# Patient Record
Sex: Female | Born: 1970 | Race: Black or African American | Hispanic: No | Marital: Married | Smoking: Former smoker
Health system: Southern US, Community
[De-identification: ages and names within clinical notes are randomized; demographics above are authoritative.]

## PROBLEM LIST (undated history)

## (undated) DIAGNOSIS — D219 Benign neoplasm of connective and other soft tissue, unspecified: Secondary | ICD-10-CM

---

## 1997-11-21 ENCOUNTER — Emergency Department (HOSPITAL_COMMUNITY): Admission: EM | Admit: 1997-11-21 | Discharge: 1997-11-21 | Payer: Self-pay | Admitting: Emergency Medicine

## 1999-08-27 ENCOUNTER — Other Ambulatory Visit: Admission: RE | Admit: 1999-08-27 | Discharge: 1999-08-27 | Payer: Self-pay | Admitting: Obstetrics and Gynecology

## 2005-03-11 ENCOUNTER — Inpatient Hospital Stay (HOSPITAL_COMMUNITY): Admission: AD | Admit: 2005-03-11 | Discharge: 2005-03-11 | Payer: Self-pay | Admitting: Obstetrics and Gynecology

## 2005-05-12 ENCOUNTER — Inpatient Hospital Stay (HOSPITAL_COMMUNITY): Admission: AD | Admit: 2005-05-12 | Discharge: 2005-05-14 | Payer: Self-pay | Admitting: Obstetrics & Gynecology

## 2009-10-23 ENCOUNTER — Ambulatory Visit: Payer: Self-pay | Admitting: Diagnostic Radiology

## 2009-10-23 ENCOUNTER — Ambulatory Visit (HOSPITAL_BASED_OUTPATIENT_CLINIC_OR_DEPARTMENT_OTHER): Admission: RE | Admit: 2009-10-23 | Discharge: 2009-10-23 | Payer: Self-pay | Admitting: Obstetrics and Gynecology

## 2010-10-20 ENCOUNTER — Other Ambulatory Visit (HOSPITAL_BASED_OUTPATIENT_CLINIC_OR_DEPARTMENT_OTHER): Payer: Self-pay | Admitting: Obstetrics and Gynecology

## 2010-10-20 DIAGNOSIS — Z1231 Encounter for screening mammogram for malignant neoplasm of breast: Secondary | ICD-10-CM

## 2010-10-27 ENCOUNTER — Ambulatory Visit (HOSPITAL_BASED_OUTPATIENT_CLINIC_OR_DEPARTMENT_OTHER): Payer: Self-pay

## 2010-10-28 ENCOUNTER — Ambulatory Visit (HOSPITAL_BASED_OUTPATIENT_CLINIC_OR_DEPARTMENT_OTHER)
Admission: RE | Admit: 2010-10-28 | Discharge: 2010-10-28 | Disposition: A | Discharge status: Home / Self Care | Payer: 59 | Source: Ambulatory Visit | Attending: Obstetrics and Gynecology | Admitting: Obstetrics and Gynecology

## 2010-10-28 DIAGNOSIS — Z1231 Encounter for screening mammogram for malignant neoplasm of breast: Secondary | ICD-10-CM | POA: Insufficient documentation

## 2011-03-14 ENCOUNTER — Emergency Department (INDEPENDENT_AMBULATORY_CARE_PROVIDER_SITE_OTHER): Payer: 59

## 2011-03-14 ENCOUNTER — Emergency Department (HOSPITAL_BASED_OUTPATIENT_CLINIC_OR_DEPARTMENT_OTHER)
Admission: EM | Admit: 2011-03-14 | Discharge: 2011-03-14 | Disposition: A | Discharge status: Home / Self Care | Payer: 59 | Attending: Emergency Medicine | Admitting: Emergency Medicine

## 2011-03-14 ENCOUNTER — Encounter: Payer: Self-pay | Admitting: *Deleted

## 2011-03-14 DIAGNOSIS — S139XXA Sprain of joints and ligaments of unspecified parts of neck, initial encounter: Secondary | ICD-10-CM | POA: Insufficient documentation

## 2011-03-14 DIAGNOSIS — M542 Cervicalgia: Secondary | ICD-10-CM

## 2011-03-14 DIAGNOSIS — Y9210 Unspecified residential institution as the place of occurrence of the external cause: Secondary | ICD-10-CM | POA: Insufficient documentation

## 2011-03-14 DIAGNOSIS — M545 Low back pain, unspecified: Secondary | ICD-10-CM | POA: Insufficient documentation

## 2011-03-14 DIAGNOSIS — S161XXA Strain of muscle, fascia and tendon at neck level, initial encounter: Secondary | ICD-10-CM

## 2011-03-14 DIAGNOSIS — M5380 Other specified dorsopathies, site unspecified: Secondary | ICD-10-CM

## 2011-03-14 DIAGNOSIS — S39012A Strain of muscle, fascia and tendon of lower back, initial encounter: Secondary | ICD-10-CM

## 2011-03-14 DIAGNOSIS — S335XXA Sprain of ligaments of lumbar spine, initial encounter: Secondary | ICD-10-CM | POA: Insufficient documentation

## 2011-03-14 LAB — PREGNANCY, URINE: Preg Test, Ur: NEGATIVE

## 2011-03-14 MED ORDER — METHOCARBAMOL 500 MG PO TABS: 500.0000 mg | ORAL_TABLET | Freq: Two times a day (BID) | ORAL | Status: AC

## 2011-03-14 MED ORDER — HYDROCODONE-ACETAMINOPHEN 5-325 MG PO TABS: 1.0000 | ORAL_TABLET | Freq: Four times a day (QID) | ORAL | Status: AC | PRN

## 2011-03-14 MED ORDER — OXYCODONE-ACETAMINOPHEN 5-325 MG PO TABS
1.0000 | ORAL_TABLET | Freq: Once | ORAL | Status: AC
  Administered 2011-03-14: 1 via ORAL
  Filled 2011-03-14: qty 1
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
## 2011-10-06 ENCOUNTER — Other Ambulatory Visit: Payer: Self-pay | Admitting: Obstetrics and Gynecology

## 2011-10-06 DIAGNOSIS — D219 Benign neoplasm of connective and other soft tissue, unspecified: Secondary | ICD-10-CM

## 2011-10-06 DIAGNOSIS — N921 Excessive and frequent menstruation with irregular cycle: Secondary | ICD-10-CM

## 2011-10-13 ENCOUNTER — Ambulatory Visit
Admission: RE | Admit: 2011-10-13 | Discharge: 2011-10-13 | Disposition: A | Discharge status: Home / Self Care | Payer: 59 | Source: Ambulatory Visit | Attending: Obstetrics and Gynecology | Admitting: Obstetrics and Gynecology

## 2011-10-13 ENCOUNTER — Other Ambulatory Visit: Payer: Self-pay | Admitting: Obstetrics and Gynecology

## 2011-10-13 DIAGNOSIS — D219 Benign neoplasm of connective and other soft tissue, unspecified: Secondary | ICD-10-CM

## 2011-10-13 DIAGNOSIS — N921 Excessive and frequent menstruation with irregular cycle: Secondary | ICD-10-CM

## 2011-10-13 DIAGNOSIS — N920 Excessive and frequent menstruation with regular cycle: Secondary | ICD-10-CM

## 2011-10-13 HISTORY — DX: Benign neoplasm of connective and other soft tissue, unspecified: D21.9

## 2011-10-16 NOTE — Progress Notes (Signed)
Patient ID: Katrina Waters, female   DOB: 1971-01-20, 41 y.o.   MRN: [id number] She is a 41 year old female who recently started having persistent and heavy menstrual bleeding. She says that these symptoms started approximately 2-3 months ago. Previously, her menstrual period had been very regular and she described it as normal. She was started on oral contraceptive pills in order to stop the bleeding to have an endometrial biopsy. She has continued to have mild menstrual bleeding every day and is requiring one pad per day. In addition, she is also complaining of some cramping and pain in the left lower quadrant of the abdomen. She also complains of frequent urination and nocturia. She has no significant history of fibroid surgery or gynecologic infections. She has recently started taking oral contraceptive pills. Recent Pap smear and endometrial biopsy are negative for neoplasm.  History for HPV status post acid treatment. She is taking Loestrin with iron 1/20. No other medications. No allergies. No past surgical history.  Social history: She is married and has one child. She works in a Airline pilot in [employer]. She denies alcohol or tobacco use. She says she smoked lightly 10 years ago. She drinks 5 cups of caffeine per week.  Gynecologic history: G2, P1.  Review of Systems: She has gained some weight recently, otherwise in good health. Denies eyes, ears, nose, throat or mouth problems. She does have some stomach and abdominal pains. GU system is significant for heavy and persistent menstrual bleeding. She denies cardiovascular or pulmonary disease. She denies neurologic or psychiatric problems.  Physical examination: Vital signs: Temperature 98.9, pulse 76, blood pressure 140/89, respirations 15. Oxygen saturation 98% on room air. General: She is a healthy appearing, overweight female. Heart: Regular rate and rhythm. Lungs: Clear to auscultation bilaterally. Abdomen: Soft and  nontender. Positive bowel sounds. Lower extremities: Bilateral groin and pedal pulses. Page 2 Re: Katrina Waters (DOB: [birth year])  Labs: Endometrial biopsy from 09/28/11 demonstrates underdeveloped endometrium with deciduoid stroma and decrease in glands to stroma ratio suggestive of progesterone effect and negative for atypical hyperplasia. Pap smear dated 08/31/10 is negative for intraepithelial lesion and malignancy. Labs on 08/31/11 demonstrate a white count of 9.5, hemoglobin 11.4 and platelet count of 339.  Imaging: Ultrasound from [facility] dated 09/10/11 demonstrates multiple fibroids. Endometrial thickness is 2.1 cm. uterus is anteverted. No abnormalities of the ovaries.  Assessment: 41 year old with menometrorrhagia. The patient's condition may very well be related to uterine fibroids. I discussed the different treatment options for uterine fibroids, including oral contraceptives, hysterectomy, myomectomy and uterine artery embolization procedure. We discussed the uterine artery embolization procedure in depth, including the need for a preprocedure MRI, the procedure itself and the recovery. I feel the patient has a very good understanding of the procedures, risks and benefits. The patient's sister had a uterine artery embolization for heavy menstrual bleeding and had a very good result. As a result, the patient would like to pursue the uterine artery embolization for her menometrorrhagia. We will schedule the patient for an MRI of the pelvis. If the patient is a good candidate for the procedure based on the imaging, we will schedule uterine artery embolization procedure at [facility].

## 2011-10-18 ENCOUNTER — Other Ambulatory Visit: Payer: 59

## 2011-10-19 ENCOUNTER — Other Ambulatory Visit: Payer: 59

## 2011-10-21 ENCOUNTER — Other Ambulatory Visit: Payer: 59

## 2011-10-26 ENCOUNTER — Ambulatory Visit
Admission: RE | Admit: 2011-10-26 | Discharge: 2011-10-26 | Disposition: A | Discharge status: Home / Self Care | Payer: 59 | Source: Ambulatory Visit | Attending: Obstetrics and Gynecology | Admitting: Obstetrics and Gynecology

## 2011-10-26 DIAGNOSIS — N920 Excessive and frequent menstruation with regular cycle: Secondary | ICD-10-CM

## 2011-10-26 DIAGNOSIS — D219 Benign neoplasm of connective and other soft tissue, unspecified: Secondary | ICD-10-CM

## 2011-10-28 ENCOUNTER — Other Ambulatory Visit (HOSPITAL_BASED_OUTPATIENT_CLINIC_OR_DEPARTMENT_OTHER): Payer: Self-pay | Admitting: Obstetrics and Gynecology

## 2011-10-28 DIAGNOSIS — Z1231 Encounter for screening mammogram for malignant neoplasm of breast: Secondary | ICD-10-CM

## 2011-10-29 ENCOUNTER — Ambulatory Visit (HOSPITAL_BASED_OUTPATIENT_CLINIC_OR_DEPARTMENT_OTHER)
Admission: RE | Admit: 2011-10-29 | Discharge: 2011-10-29 | Disposition: A | Discharge status: Home / Self Care | Payer: 59 | Source: Ambulatory Visit | Attending: Obstetrics and Gynecology | Admitting: Obstetrics and Gynecology

## 2011-10-29 DIAGNOSIS — Z1231 Encounter for screening mammogram for malignant neoplasm of breast: Secondary | ICD-10-CM

## 2011-10-30 ENCOUNTER — Other Ambulatory Visit: Payer: Self-pay | Admitting: Diagnostic Radiology

## 2011-10-30 DIAGNOSIS — N920 Excessive and frequent menstruation with regular cycle: Secondary | ICD-10-CM

## 2011-11-12 ENCOUNTER — Ambulatory Visit (HOSPITAL_COMMUNITY): Payer: 59

## 2011-11-12 ENCOUNTER — Inpatient Hospital Stay (HOSPITAL_COMMUNITY): Admission: RE | Admit: 2011-11-12 | Payer: 59 | Source: Ambulatory Visit

## 2011-12-01 ENCOUNTER — Other Ambulatory Visit: Payer: Self-pay | Admitting: Radiology

## 2011-12-03 ENCOUNTER — Ambulatory Visit (HOSPITAL_COMMUNITY)
Admission: RE | Admit: 2011-12-03 | Discharge: 2011-12-04 | Disposition: A | Discharge status: Home / Self Care | Payer: 59 | Source: Ambulatory Visit | Attending: Diagnostic Radiology | Admitting: Diagnostic Radiology

## 2011-12-03 ENCOUNTER — Encounter (HOSPITAL_COMMUNITY): Payer: Self-pay

## 2011-12-03 DIAGNOSIS — N920 Excessive and frequent menstruation with regular cycle: Secondary | ICD-10-CM

## 2011-12-03 DIAGNOSIS — D219 Benign neoplasm of connective and other soft tissue, unspecified: Secondary | ICD-10-CM | POA: Diagnosis present

## 2011-12-03 DIAGNOSIS — N938 Other specified abnormal uterine and vaginal bleeding: Secondary | ICD-10-CM | POA: Insufficient documentation

## 2011-12-03 DIAGNOSIS — D259 Leiomyoma of uterus, unspecified: Secondary | ICD-10-CM | POA: Insufficient documentation

## 2011-12-03 DIAGNOSIS — D649 Anemia, unspecified: Secondary | ICD-10-CM | POA: Insufficient documentation

## 2011-12-03 DIAGNOSIS — N949 Unspecified condition associated with female genital organs and menstrual cycle: Secondary | ICD-10-CM | POA: Insufficient documentation

## 2011-12-03 HISTORY — PX: UTERINE ARTERY EMBOLIZATION: SHX2629

## 2011-12-03 LAB — CBC WITH DIFFERENTIAL/PLATELET
Basophils Relative: 0 % (ref 0–1)
Eosinophils Absolute: 0.1 10*3/uL (ref 0.0–0.7)
Eosinophils Relative: 1 % (ref 0–5)
Lymphs Abs: 2.1 10*3/uL (ref 0.7–4.0)
MCH: 28.1 pg (ref 26.0–34.0)
MCHC: 32.2 g/dL (ref 30.0–36.0)
MCV: 87.2 fL (ref 78.0–100.0)
Monocytes Relative: 5 % (ref 3–12)
Neutrophils Relative %: 64 % (ref 43–77)
Platelets: 326 10*3/uL (ref 150–400)
RBC: 3.84 MIL/uL — ABNORMAL LOW (ref 3.87–5.11)

## 2011-12-03 LAB — BASIC METABOLIC PANEL
BUN: 8 mg/dL (ref 6–23)
CO2: 24 mEq/L (ref 19–32)
Calcium: 9 mg/dL (ref 8.4–10.5)
GFR calc non Af Amer: >90 mL/min (ref >90)
Glucose, Bld: 102 mg/dL — ABNORMAL HIGH (ref 70–99)

## 2011-12-03 LAB — PROTIME-INR: Prothrombin Time: 12.8 seconds (ref 11.6–15.2)

## 2011-12-03 MED ORDER — IOHEXOL 300 MG/ML  SOLN: 90.0000 mL | Freq: Once | INTRAMUSCULAR | Status: AC | PRN

## 2011-12-03 MED ORDER — ONDANSETRON HCL 4 MG/2ML IJ SOLN: 4.0000 mg | Freq: Four times a day (QID) | INTRAMUSCULAR | Status: DC | PRN

## 2011-12-03 MED ORDER — MIDAZOLAM HCL 5 MG/5ML IJ SOLN
INTRAMUSCULAR | Status: AC | PRN
  Administered 2011-12-03 (×2): 2 mg via INTRAVENOUS

## 2011-12-03 MED ORDER — HYDROMORPHONE 0.3 MG/ML IV SOLN
INTRAVENOUS | Status: DC
  Administered 2011-12-03: 0.9 mg via INTRAVENOUS
  Administered 2011-12-03: 1.5 mg via INTRAVENOUS
  Administered 2011-12-04: 0.9 mg via INTRAVENOUS
  Administered 2011-12-04: 0.3 mg via INTRAVENOUS
  Administered 2011-12-04: 1.5 mg via INTRAVENOUS
  Administered 2011-12-04: 0.6 mg via INTRAVENOUS

## 2011-12-03 MED ORDER — FENTANYL CITRATE 0.05 MG/ML IJ SOLN
INTRAMUSCULAR | Status: AC
  Filled 2011-12-03: qty 8

## 2011-12-03 MED ORDER — DIPHENHYDRAMINE HCL 50 MG/ML IJ SOLN: 12.5000 mg | Freq: Four times a day (QID) | INTRAMUSCULAR | Status: DC | PRN

## 2011-12-03 MED ORDER — SODIUM CHLORIDE 0.9 % IJ SOLN: 9.0000 mL | INTRAMUSCULAR | Status: DC | PRN

## 2011-12-03 MED ORDER — NALOXONE HCL 0.4 MG/ML IJ SOLN: 0.4000 mg | INTRAMUSCULAR | Status: DC | PRN

## 2011-12-03 MED ORDER — HYDROMORPHONE HCL PF 2 MG/ML IJ SOLN
INTRAMUSCULAR | Status: AC
  Filled 2011-12-03: qty 1

## 2011-12-03 MED ORDER — DIPHENHYDRAMINE HCL 12.5 MG/5ML PO ELIX: 12.5000 mg | ORAL_SOLUTION | Freq: Four times a day (QID) | ORAL | Status: DC | PRN

## 2011-12-03 MED ORDER — CEFAZOLIN SODIUM-DEXTROSE 2-3 GM-% IV SOLR
2.0000 g | Freq: Once | INTRAVENOUS | Status: AC
  Administered 2011-12-03: 2 g via INTRAVENOUS
  Filled 2011-12-03: qty 50

## 2011-12-03 MED ORDER — HYDROMORPHONE HCL PF 1 MG/ML IJ SOLN
INTRAMUSCULAR | Status: AC | PRN
  Administered 2011-12-03: 2 mg via INTRAVENOUS

## 2011-12-03 MED ORDER — PROMETHAZINE HCL 25 MG PO TABS
25.0000 mg | ORAL_TABLET | Freq: Three times a day (TID) | ORAL | Status: DC | PRN
  Administered 2011-12-03: 25 mg via ORAL
  Filled 2011-12-03: qty 1

## 2011-12-03 MED ORDER — DIPHENHYDRAMINE HCL 12.5 MG/5ML PO ELIX
12.5000 mg | ORAL_SOLUTION | Freq: Four times a day (QID) | ORAL | Status: DC | PRN
  Filled 2011-12-03: qty 5

## 2011-12-03 MED ORDER — SODIUM CHLORIDE 0.9 % IV SOLN: INTRAVENOUS | Status: DC

## 2011-12-03 MED ORDER — SODIUM CHLORIDE 0.9 % IJ SOLN: 3.0000 mL | Freq: Two times a day (BID) | INTRAMUSCULAR | Status: DC

## 2011-12-03 MED ORDER — KETOROLAC TROMETHAMINE 30 MG/ML IJ SOLN
30.0000 mg | Freq: Four times a day (QID) | INTRAMUSCULAR | Status: DC
  Administered 2011-12-03 – 2011-12-04 (×3): 30 mg via INTRAVENOUS
  Filled 2011-12-03 (×7): qty 1

## 2011-12-03 MED ORDER — ONDANSETRON HCL 4 MG/2ML IJ SOLN
4.0000 mg | Freq: Four times a day (QID) | INTRAMUSCULAR | Status: DC | PRN
  Administered 2011-12-03: 4 mg via INTRAVENOUS
  Filled 2011-12-03: qty 2

## 2011-12-03 MED ORDER — KETOROLAC TROMETHAMINE 30 MG/ML IJ SOLN
30.0000 mg | Freq: Once | INTRAMUSCULAR | Status: AC
  Administered 2011-12-03: 30 mg via INTRAVENOUS
  Filled 2011-12-03: qty 1

## 2011-12-03 MED ORDER — FENTANYL CITRATE 0.05 MG/ML IJ SOLN
INTRAMUSCULAR | Status: AC | PRN
  Administered 2011-12-03 (×2): 100 ug via INTRAVENOUS

## 2011-12-03 MED ORDER — MIDAZOLAM HCL 2 MG/2ML IJ SOLN
INTRAMUSCULAR | Status: AC
  Filled 2011-12-03: qty 8

## 2011-12-03 MED ORDER — HYDROMORPHONE 0.3 MG/ML IV SOLN
INTRAVENOUS | Status: DC
  Administered 2011-12-03: 10:00:00 via INTRAVENOUS
  Filled 2011-12-03: qty 25

## 2011-12-03 MED ORDER — PROMETHAZINE HCL 25 MG RE SUPP: 25.0000 mg | Freq: Three times a day (TID) | RECTAL | Status: DC | PRN

## 2011-12-03 MED ORDER — DOCUSATE SODIUM 100 MG PO CAPS
100.0000 mg | ORAL_CAPSULE | Freq: Two times a day (BID) | ORAL | Status: DC
  Administered 2011-12-03 – 2011-12-04 (×2): 100 mg via ORAL
  Filled 2011-12-03 (×4): qty 1

## 2011-12-03 NOTE — H&P (Signed)
Katrina Waters is an 41 y.o. female.   Chief Complaint: symptomatic uterine fibroids - presents today for uterine artery embolization procedure.   HPI: see consult note below : *RADIOLOGY REPORT*   October 13, 2011   Katrina Better, MD [address] [place], [place] [address]   Re: Katrina Waters (DOB:  [birth year])   Dear Dr. Cherly Waters:   Thank you for sending Katrina Waters to [facility] for evaluation of her menorrhagia.  She is a 41 year old female who recently started having persistent and heavy menstrual bleeding. She says that these symptoms started approximately 2-3 months ago. Previously, her menstrual period had been very regular and she described it as normal.  She was started on oral contraceptive pills in order to stop the bleeding to have an endometrial biopsy. She has continued to have mild menstrual bleeding every day and is requiring one pad per day.  In addition, she is also complaining of some cramping and pain in the left lower quadrant of the abdomen. She also complains of frequent urination and nocturia.  She has no significant history of fibroid surgery or gynecologic infections. She has recently started taking oral contraceptive pills.  Recent Pap smear and endometrial biopsy are negative for neoplasm.   History for HPV status post acid treatment.  She is taking Loestrin with iron 1/20.  No other medications. No allergies.  No past surgical history.   Social history:  She is married and has one child.  She works in a Airline pilot in [employer].  She denies alcohol or tobacco use.  She says she smoked lightly 10 years ago.  She drinks 5 cups of caffeine per week.   Gynecologic history:  G2, P1.   Review of Systems:  She has gained some weight recently, otherwise in good health.  Denies eyes, ears, nose, throat or mouth problems. She does have some stomach and abdominal pains.  GU system is significant for heavy and persistent menstrual  bleeding. She denies cardiovascular or pulmonary disease.  She denies neurologic or psychiatric problems.   Physical examination:  Vital signs:  Temperature 98.9, pulse 76, blood pressure 140/89, respirations 15.  Oxygen saturation 98% on room air.  General:  She is a healthy appearing, overweight female. Heart:  Regular rate and rhythm.  Lungs:  Clear to auscultation bilaterally.  Abdomen:  Soft and nontender.  Positive bowel sounds. Lower extremities:  Bilateral groin and pedal pulses. Page 2 Re: Katrina Waters (DOB:  [birth year])   Labs:  Endometrial biopsy from 09/28/11 demonstrates underdeveloped endometrium with deciduoid stroma and decrease in glands to stroma ratio suggestive of progesterone effect and negative for atypical hyperplasia.  Pap smear dated 08/31/10 is negative for intraepithelial lesion and malignancy.  Labs on 08/31/11 demonstrate a white count of 9.5, hemoglobin 11.4 and platelet count of 339.   Imaging:  Ultrasound from [facility] dated 09/10/11 demonstrates multiple fibroids.  Endometrial thickness is 2.1 cm. uterus is anteverted.  No abnormalities of the ovaries.   Assessment:  41 year old with menometrorrhagia. The patient's condition may very well be related to uterine fibroids.  I discussed the different treatment options for uterine fibroids, including oral contraceptives, hysterectomy, myomectomy and uterine artery embolization procedure.  We discussed the uterine artery embolization procedure in depth, including the need for a preprocedure MRI, the procedure itself and the recovery.  I feel the patient has a very good understanding of the procedures, risks and benefits.   The patient's sister had a uterine artery embolization for  heavy menstrual bleeding and had a very good result.  As a result, the patient would like to pursue the uterine artery embolization for her menometrorrhagia.  We will schedule the patient for an MRI of the pelvis.  If the  patient is a good candidate for the procedure based on the imaging, we will schedule uterine artery embolization procedure at [facility].   Thank you for referring this patient to [facility].  If you have any questions or concerns, please feel free to contact us.   Sincerely,   Katrina Overlie, MD  Today's examination findings :  Past Medical History  Diagnosis Date  . Fibroids    No past surgical history on file.  Social History:  reports that she quit smoking about 5 months ago. Her smoking use included Cigarettes. She has a 1.5 pack-year smoking history. She has never used smokeless tobacco. She reports that she does not drink alcohol or use illicit drugs. Married, husband's name is Katrina Waters. 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   Allergies: No Known Allergies  Medications Prior to Admission  Medication Sig Dispense Refill  . norethindrone-ethinyl estradiol (JUNEL FE,GILDESS FE,LOESTRIN FE) 1-20 MG-MCG tablet Take 1 tablet by mouth daily.          Prior Imaging : RADIOLOGY REPORT*   Clinical Data: Uterine fibroids.  Pre uterine artery embolization exam.   MRI PELVIS WITHOUT AND WITH CONTRAST   Technique:  Multiplanar multisequence MR imaging of the pelvis was performed both before and after administration of intravenous contrast.   Contrast:  20 ml Multihance   Comparison: None.   Findings: The uterus is anteverted and anteflexed.  Overall measurements 13.2 x 9.9 x 10.0 cm (686 ml volume).  Multiple presumed fibroids are noted, with dominant lesions as follows:   Right posterolateral uterine body, 5.1 x 4.9 x 4.8 cm, without internal evidence for necrosis or hemorrhage.   Left lateral uterine body intramural partly necrotic fibroid, producing rightward displacement of the endometrial stripe, 5.3 x 5.0 x 4.7 cm.   Partly subserosal / intramural fundal anterior fibroid, 2.0 x 1.6 x 1.4 cm.   Partly subserosal / intramural anterior uterine  body fibroid which is the most relatively anterior in location measures 3.1 x 2.9 x 4.8 cm, with no measurable overlying myometrial thickness (image 14, series 4).   The ovaries are normal.  Visualized bowel, appendix, and decompressed bladder are normal.  No free fluid or lymphadenopathy. Normal visualized bony structures allowing for technique.   On postcontrast images, there is homogeneous enhancement of many of the fibroids listed above, including the smaller fundal subserosal fibroid, although several fibroids do demonstrate a degree of internal presumed necrosis/T2 hyperintensity and lack of internal enhancement, including the 5.3 cm fibroid described above.   IMPRESSION: Fibroid uterus as above.  Most are predominately intramural in position, and some produce mass effect upon the endometrial stripe.   Original Report Authenticated By: Harrel Lemon, M.  Labs :  Results for orders placed during the [facility] encounter of 12/03/11 (from the past 48 hour(s))  APTT     Status: Normal   Collection Time   12/03/11  8:11 AM      Component Value Range Comment   aPTT 30  24 - 37 seconds   BASIC METABOLIC PANEL     Status: Abnormal   Collection Time   12/03/11  8:11 AM      Component Value Range Comment   Sodium 135  135 - 145 mEq/L    Potassium 3.8  3.5 - 5.1 mEq/L    Chloride 103  96 - 112 mEq/L    CO2 24  19 - 32 mEq/L    Glucose, Bld 102 (*) 70 - 99 mg/dL    BUN 8  6 - 23 mg/dL    Creatinine, Ser 1.61  0.50 - 1.10 mg/dL    Calcium 9.0  8.4 - 09.6 mg/dL    GFR calc non Af Amer >90  >90 mL/min    GFR calc Af Amer >90  >90 mL/min   CBC WITH DIFFERENTIAL     Status: Abnormal   Collection Time   12/03/11  8:11 AM      Component Value Range Comment   WBC 7.0  4.0 - 10.5 K/uL    RBC 3.84 (*) 3.87 - 5.11 MIL/uL    Hemoglobin 10.8 (*) 12.0 - 15.0 g/dL    HCT 04.5 (*) 40.9 - 46.0 %    MCV 87.2  78.0 - 100.0 fL    MCH 28.1  26.0 - 34.0 pg    MCHC 32.2  30.0 - 36.0 g/dL    RDW  81.1  91.4 - 78.2 %    Platelets 326  150 - 400 K/uL    Neutrophils Relative 64  43 - 77 %    Neutro Abs 4.5  1.7 - 7.7 K/uL    Lymphocytes Relative 30  12 - 46 %    Lymphs Abs 2.1  0.7 - 4.0 K/uL    Monocytes Relative 5  3 - 12 %    Monocytes Absolute 0.4  0.1 - 1.0 K/uL    Eosinophils Relative 1  0 - 5 %    Eosinophils Absolute 0.1  0.0 - 0.7 K/uL    Basophils Relative 0  0 - 1 %    Basophils Absolute 0.0  0.0 - 0.1 K/uL   HCG, SERUM, QUALITATIVE     Status: Normal   Collection Time   12/03/11  8:11 AM      Component Value Range Comment   Preg, Serum NEGATIVE  NEGATIVE   PROTIME-INR     Status: Normal   Collection Time   12/03/11  8:11 AM      Component Value Range Comment   Prothrombin Time 12.8  11.6 - 15.2 seconds    INR 0.94  0.00 - 1.49     Review of Systems  Constitutional: Negative for fever and chills.  Respiratory: Negative.   Cardiovascular: Negative.   Gastrointestinal: Negative.        Obese abdomen   Genitourinary: Positive for urgency and frequency.       Nocturia   Musculoskeletal: Negative.   Skin: Negative.   Neurological: Negative.   Endo/Heme/Allergies: Does not bruise/bleed easily.       Anemia   Psychiatric/Behavioral: Negative.     Blood pressure 129/85, pulse 76, temperature 98.2 F (36.8 C), resp. rate 21, last menstrual period 12/01/2011, SpO2 99.00%. Physical Exam  Constitutional: She is oriented to person, place, and time. She appears well-developed and well-nourished. No distress.  HENT:  Head: Normocephalic and atraumatic.  Cardiovascular: Normal rate, regular rhythm, normal heart sounds and intact distal pulses.  Exam reveals no gallop and no friction rub.   No murmur heard. Respiratory: Effort normal and breath sounds normal. No respiratory distress. She has no wheezes. She has no rales.  GI: Soft. She exhibits no distension. There is no tenderness.  Musculoskeletal: Normal  range of motion. She exhibits no edema and no tenderness.    Neurological: She is alert and oriented to person, place, and time.  Skin: Skin is warm and dry.  Psychiatric: She has a normal mood and affect. Her behavior is normal. Judgment and thought content normal.     Assessment/Plan Procedure details, risks and benefits for [place] reviewed with patient and spouse with their apparent understanding and all their questions answered to their satisfaction. Labs are WNL to proceed. Patient to be admitted for observation overnight for pain management with expected discharge following day.   Vesper Trant D 12/03/2011, 8:54 AM

## 2011-12-03 NOTE — Procedures (Signed)
Successful bilateral uterine artery embolization.  No immediate complication.

## 2011-12-04 DIAGNOSIS — D219 Benign neoplasm of connective and other soft tissue, unspecified: Secondary | ICD-10-CM | POA: Diagnosis present

## 2011-12-04 MED ORDER — HYDROCODONE-ACETAMINOPHEN 5-325 MG PO TABS
1.0000 | ORAL_TABLET | Freq: Four times a day (QID) | ORAL | Status: DC | PRN
  Administered 2011-12-04: 1 via ORAL
  Filled 2011-12-04: qty 1

## 2011-12-04 MED ORDER — DSS 100 MG PO CAPS: 100.0000 mg | ORAL_CAPSULE | Freq: Two times a day (BID) | ORAL | Status: AC

## 2011-12-04 MED ORDER — PROMETHAZINE HCL 25 MG PO TABS: 25.0000 mg | ORAL_TABLET | Freq: Three times a day (TID) | ORAL | Status: DC | PRN

## 2011-12-04 MED ORDER — HYDROCODONE-ACETAMINOPHEN 5-325 MG PO TABS: 1.0000 | ORAL_TABLET | Freq: Four times a day (QID) | ORAL | Status: AC | PRN

## 2011-12-04 MED ORDER — IBUPROFEN 600 MG PO TABS: 600.0000 mg | ORAL_TABLET | Freq: Four times a day (QID) | ORAL | Status: AC

## 2011-12-04 NOTE — Progress Notes (Signed)
Subjective:  Feeling ok post procedure Foley out, ambulate to bathroom without problems. Pain adequately controlled with occasional PCA use.  Objective: Vital signs in last 24 hours: Afebrile  Husband at bedside. Groin site C/D/I  Intake/Output:   A few crackers.   Studies/Results: Ir Uterine Fibroid Embolization Mod Sed  12/03/2011  *RADIOLOGY REPORT*  Indication: 41 year old with menometrorrhagia and uterine fibroids.  PROCEDURE(S):  BILATERAL UTERINE ARTERY EMBOLIZATION  Physician:  Rachelle Hora. Henn, MD  Medications: Versed 4 mg, Fentanyl 200 mcg.  Dilaudid 2 mg. Ancef 2 gm. A radiology nurse monitored the patient for moderate sedation.  As antibiotic prophylaxis, Ancef  was ordered pre-procedure and administered intravenously within one hour of incision.  Moderate sedation time: 95 minutes  Fluoroscopy time: 23.5 minutes  Contrast:  90 ml Omnipaque-300  Procedure:Informed consent was obtained for the uterine artery embolization procedure.  The patient was placed supine on the interventional table.  The patient had palpable pedal pulses.  The right groin was prepped and draped in a sterile fashion.  Maximal barrier sterile technique was utilized including caps, mask, sterile gowns, sterile gloves, sterile drape, hand hygiene and skin antiseptic.  The skin was anesthetized 1% lidocaine.  Using ultrasound guidance, 21 gauge needle was directed in the right common femoral artery and a micropuncture dilator set was placed. The vascular access was upsized to a 5-French vascular sheath.  A Cobra catheter was used to cannulate the left common iliac artery and the left internal iliac artery.  A series of arteriograms were performed to identify the left uterine artery orfice.  A Progreat microcatheter was advanced into the left uterine artery.  Two vials of Embospheres (500 - 700 micron) were injected through the microcatheter with fluoroscopic guidance.  There was near complete stasis of the left  uterine artery following administration of the particles.  A Waltman's loop was formed using the Cobra catheter and the right internal iliac artery was cannulated.  The right uterine artery was identified with contrast angiograms.  The microcatheter was advanced into the right uterine artery and a series of angiograms were performed. Two vials of Embospheres (500- 700 micron) were injected through the microcatheter with fluoroscopic guidance.  There was near complete stasis of the right uterine artery at the end of the procedure.  The microcatheter was removed.  The Cobra catheter was straightened out over the aortic bifurcation and removed over a wire.  Angiogram performed through the right groin sheath.  The vascular sheath was removed with an Exoseal closure device . There was hemostasis at the groin and no evidence for hematoma formation or bleeding.  Findings:Large uterine arteries bilaterally.  Near complete stasis of the uterine arteries at the end of the procedure. The micro catheter was advanced beyond a proximal right uterine branch prior to particle administration.  This proximal right uterine branch was thought to represent a cervicovaginal branch.  Fluoroscopic images were obtained for documentation.  Complicatons: None  Impression:Successful uterine artery embolization procedure.   Original Report Authenticated By: Richarda Overlie, M.D.     Anti-infectives: Anti-infectives     Start     Dose/Rate Route Frequency Ordered Stop   12/03/11 0800   ceFAZolin (ANCEF) IVPB 2 g/50 mL premix        2 g 100 mL/hr over 30 Minutes Intravenous  Once 12/03/11 1610 12/03/11 1033          Assessment/Plan: S/p UFE without early complication Continue conservative management, prob home in AM   LOS: 1 day  Katrina Waters III,Katrina Waters

## 2011-12-04 NOTE — Discharge Summary (Signed)
Physician Discharge Summary  Patient ID: Katrina Waters MRN: [id number] DOB/AGE: [id number] 42 y.o.  Admit date: 12/03/2011 Discharge date: 12/04/2011  Admission Diagnoses: 1. Uterine fibroids 2. Dysfunctional uterine bleeding 3. Anemia  Discharge Diagnoses:  1) uterine fibroids s/p uterine artery embolization.   Discharged Condition: stable  Hospital Course: Patient admitted for overnight observation for pain management and hemodynamic monitoring s/p elective uterine artery embolization for dysfunctional uterine bleeding, anemia and menorrhagia.  Patient had pain controlled in the evening with PCA pump.  She experienced no nausea, vomiting and was tolerating a full regular diet.  Her vitals remained stable throughout admission. Prior to discharge she was changed to po pain meds of which she tolerated without difficulty.  She was discharged to home with several prescriptions, and to be seen back in the clinic for follow up with Dr. Lowella Dandy. Post discharge instructions reviewed with patient with her apparent understanding.   Consults: none  Significant Diagnostic Studies:  Results for LAYNA, ROEPER (MRN [id number]) as of 12/04/2011 11:31  Ref. Range 12/03/2011 08:11  Sodium Latest Range: 135-145 mEq/L 135  Potassium Latest Range: 3.5-5.1 mEq/L 3.8  Chloride Latest Range: 96-112 mEq/L 103  CO2 Latest Range: 19-32 mEq/L 24  BUN Latest Range: 6-23 mg/dL 8  Creatinine Latest Range: 0.50-1.10 mg/dL 1.30  Calcium Latest Range: 8.4-10.5 mg/dL 9.0  GFR calc non Af Amer Latest Range: >90 mL/min >90  GFR calc Af Amer Latest Range: >90 mL/min >90  Glucose Latest Range: 70-99 mg/dL 865 (H)  WBC Latest Range: 4.0-10.5 K/uL 7.0  RBC Latest Range: 3.87-5.11 MIL/uL 3.84 (L)  Hemoglobin Latest Range: 12.0-15.0 g/dL 78.4 (L)  HCT Latest Range: 36.0-46.0 % 33.5 (L)  MCV Latest Range: 78.0-100.0 fL 87.2  MCH Latest Range: 26.0-34.0 pg 28.1  MCHC Latest Range: 30.0-36.0 g/dL 69.6  RDW Latest Range:  11.5-15.5 % 13.6  Platelets Latest Range: 150-400 K/uL 326  Neutrophils Relative Latest Range: 43-77 % 64  Lymphocytes Relative Latest Range: 12-46 % 30  Monocytes Relative Latest Range: 3-12 % 5  Eosinophils Relative Latest Range: 0-5 % 1  Basophils Relative Latest Range: 0-1 % 0  NEUT# Latest Range: 1.7-7.7 K/uL 4.5  Lymphocytes Absolute Latest Range: 0.7-4.0 K/uL 2.1  Monocytes Absolute Latest Range: 0.1-1.0 K/uL 0.4  Eosinophils Absolute Latest Range: 0.0-0.7 K/uL 0.1  Basophils Absolute Latest Range: 0.0-0.1 K/uL 0.0  Prothrombin Time Latest Range: 11.6-15.2 seconds 12.8  INR Latest Range: 0.00-1.49  0.94  aPTT Latest Range: 24-37 seconds 30  Preg, Serum Latest Range: NEGATIVE  NEGATIVE    Treatments: *RADIOLOGY REPORT*   Indication: 41 year old with menometrorrhagia and uterine fibroids.   PROCEDURE(S):  BILATERAL UTERINE ARTERY EMBOLIZATION   Physician:  Rachelle Hora. Henn, MD   Medications: Versed 4 mg, Fentanyl 200 mcg.  Dilaudid 2 mg. Ancef 2 gm. A radiology nurse monitored the patient for moderate sedation.   As antibiotic prophylaxis, Ancef  was ordered pre-procedure and administered intravenously within one hour of incision.   Moderate sedation time: 95 minutes   Fluoroscopy time: 23.5 minutes   Contrast:  90 ml Omnipaque-300   Procedure:Informed consent was obtained for the uterine artery embolization procedure.  The patient was placed supine on the interventional table.  The patient had palpable pedal pulses.  The right groin was prepped and draped in a sterile fashion.  Maximal barrier sterile technique was utilized including caps, mask, sterile gowns, sterile gloves, sterile drape, hand hygiene and skin antiseptic.  The skin was anesthetized 1% lidocaine.  Using ultrasound guidance,  21 gauge needle was directed in the right common femoral artery and a micropuncture dilator set was placed. The vascular access was upsized to a 5-French vascular sheath.   A Cobra catheter was used to cannulate the left common iliac artery and the left internal iliac artery.  A series of arteriograms were performed to identify the left uterine artery orfice.  A Progreat microcatheter was advanced into the left uterine artery.  Two vials of Embospheres (500 - 700 micron) were injected through the microcatheter with fluoroscopic guidance.  There was near complete stasis of the left uterine artery following administration of the particles.  A Waltman's loop was formed using the Cobra catheter and the right internal iliac artery was cannulated.  The right uterine artery was identified with contrast angiograms.  The microcatheter was advanced into the right uterine artery and a series of angiograms were performed. Two vials of Embospheres (500- 700 micron) were injected through the microcatheter with fluoroscopic guidance.  There was near complete stasis of the right uterine artery at the end of the procedure.  The microcatheter was removed.  The Cobra catheter was straightened out over the aortic bifurcation and removed over a wire.  Angiogram performed through the right groin sheath.  The vascular sheath was removed with an Exoseal closure device . There was hemostasis at the groin and no evidence for hematoma formation or bleeding.   Findings:Large uterine arteries bilaterally.  Near complete stasis of the uterine arteries at the end of the procedure. The micro catheter was advanced beyond a proximal right uterine branch prior to particle administration.  This proximal right uterine branch was thought to represent a cervicovaginal branch.  Fluoroscopic images were obtained for documentation.   Complicatons: None   Impression:Successful uterine artery embolization procedure.     Original Report Authenticated By: Richarda Overlie, M.D.   Discharge Exam: Blood pressure 114/70, pulse 76, temperature 98.2 F (36.8 C), temperature source Oral, resp. rate 14,  last menstrual period 12/01/2011, SpO2 96.00%.  PE:  Awake, alert, tolerating diet.   Has minimal pain.  Scant spotting last pm has resolved. Resp: CTA bilaterally CV: RRR without murmurs rubs or gallops Abd: soft, Nondistended, + BS.  MS:  Groin site with clean bandage, no hematoma.  Right femoral and RT DP pulses 2+ with warm RLE.   Disposition: 01-Home or Self Care   Medication List  As of 12/04/2011 11:27 AM   TAKE these medications         DSS 100 MG Caps   Take 100 mg by mouth 2 (two) times daily.      HYDROcodone-acetaminophen 5-325 MG per tablet   Commonly known as: NORCO/VICODIN   Take 1-2 tablets by mouth every 6 (six) hours as needed.      ibuprofen 600 MG tablet   Commonly known as: ADVIL,MOTRIN   Take 1 tablet (600 mg total) by mouth 4 (four) times daily.      norethindrone-ethinyl estradiol 1-20 MG-MCG tablet   Commonly known as: JUNEL FE,GILDESS FE,LOESTRIN FE   Take 1 tablet by mouth daily.      promethazine 25 MG tablet   Commonly known as: PHENERGAN   Take 1 tablet (25 mg total) by mouth every 8 (eight) hours as needed for nausea.           Follow-up Information    Follow up with Abundio Miu, MD. (Tammy from office to call you with appointment details)    Contact information:   [facility] at  [facility]  Tammy [phone]           Signed: Anselm Pancoast 12/04/2011, 11:27 AM

## 2011-12-15 ENCOUNTER — Other Ambulatory Visit: Payer: Self-pay | Admitting: Diagnostic Radiology

## 2011-12-15 DIAGNOSIS — D259 Leiomyoma of uterus, unspecified: Secondary | ICD-10-CM

## 2011-12-22 ENCOUNTER — Ambulatory Visit
Admission: RE | Admit: 2011-12-22 | Discharge: 2011-12-22 | Disposition: A | Discharge status: Home / Self Care | Payer: 59 | Source: Ambulatory Visit | Attending: Diagnostic Radiology | Admitting: Diagnostic Radiology

## 2011-12-22 DIAGNOSIS — D259 Leiomyoma of uterus, unspecified: Secondary | ICD-10-CM

## 2011-12-22 NOTE — Progress Notes (Signed)
LMP:  12-07-2011 x 5-6 days.  Day 1 heavy flow.  States that she is experiencing occasional spotting & cramping.  Afebrile. Denies foul odor. Has started walking for exercise.  States that she is doing well.  No complaints.

## 2012-03-07 ENCOUNTER — Telehealth: Payer: Self-pay | Admitting: Radiology

## 2012-03-07 NOTE — Telephone Encounter (Signed)
3 mo f/u [place] phone call.  Pt reports that she is doing well.  LMP:  02/29/2012.  Minimal spotting.  Minimal cramping.    States that she does not feel that she needs app't for f/u at present & will wait for 6 mo post [place] for f/u.

## 2012-05-10 ENCOUNTER — Other Ambulatory Visit (HOSPITAL_COMMUNITY): Payer: Self-pay | Admitting: Diagnostic Radiology

## 2012-05-10 DIAGNOSIS — D219 Benign neoplasm of connective and other soft tissue, unspecified: Secondary | ICD-10-CM

## 2012-05-18 ENCOUNTER — Other Ambulatory Visit: Payer: Self-pay | Admitting: Obstetrics and Gynecology

## 2012-05-18 DIAGNOSIS — D259 Leiomyoma of uterus, unspecified: Secondary | ICD-10-CM

## 2012-05-18 DIAGNOSIS — N920 Excessive and frequent menstruation with regular cycle: Secondary | ICD-10-CM

## 2012-06-15 ENCOUNTER — Ambulatory Visit
Admission: RE | Admit: 2012-06-15 | Discharge: 2012-06-15 | Disposition: A | Discharge status: Home / Self Care | Payer: 59 | Source: Ambulatory Visit | Attending: Obstetrics and Gynecology | Admitting: Obstetrics and Gynecology

## 2012-06-15 ENCOUNTER — Ambulatory Visit
Admission: RE | Admit: 2012-06-15 | Discharge: 2012-06-15 | Disposition: A | Discharge status: Home / Self Care | Payer: 59 | Source: Ambulatory Visit | Attending: Diagnostic Radiology | Admitting: Diagnostic Radiology

## 2012-06-15 DIAGNOSIS — N920 Excessive and frequent menstruation with regular cycle: Secondary | ICD-10-CM

## 2012-06-15 DIAGNOSIS — D259 Leiomyoma of uterus, unspecified: Secondary | ICD-10-CM

## 2012-06-15 DIAGNOSIS — D219 Benign neoplasm of connective and other soft tissue, unspecified: Secondary | ICD-10-CM

## 2012-06-15 MED ORDER — GADOBENATE DIMEGLUMINE 529 MG/ML IV SOLN
20.0000 mL | Freq: Once | INTRAVENOUS | Status: AC | PRN
  Administered 2012-06-15: 20 mL via INTRAVENOUS

## 2012-06-15 NOTE — Progress Notes (Signed)
LMP:  06/09/2012.  Lasting 3 days.  Decreased flow w/o clots posts [place].  Occasionally will experience spotting 1-2 days before cycle begins.    Very minimal cramping w/ menstruation.     Overall doing well & is pleased w/ post [place] results.

## 2012-10-13 ENCOUNTER — Other Ambulatory Visit (HOSPITAL_BASED_OUTPATIENT_CLINIC_OR_DEPARTMENT_OTHER): Payer: Self-pay | Admitting: Obstetrics and Gynecology

## 2012-10-13 DIAGNOSIS — Z1231 Encounter for screening mammogram for malignant neoplasm of breast: Secondary | ICD-10-CM

## 2012-10-31 ENCOUNTER — Ambulatory Visit (HOSPITAL_BASED_OUTPATIENT_CLINIC_OR_DEPARTMENT_OTHER)
Admission: RE | Admit: 2012-10-31 | Discharge: 2012-10-31 | Disposition: A | Discharge status: Home / Self Care | Payer: 59 | Source: Ambulatory Visit | Attending: Obstetrics and Gynecology | Admitting: Obstetrics and Gynecology

## 2012-10-31 DIAGNOSIS — Z1231 Encounter for screening mammogram for malignant neoplasm of breast: Secondary | ICD-10-CM | POA: Insufficient documentation

## 2013-08-03 IMAGING — XA IR UTERINE FIBROID EMBOLIZATION
12 of 14 series · 13 of 24 positions shown · IV contrast (IODINE)
Comparison: none

INDICATION: 41-year-old with menometrorrhagia and uterine fibroids.

[Series 2: care body 4 · 1 of 20 frames shown (1 of 12)]
[frame 4/20]
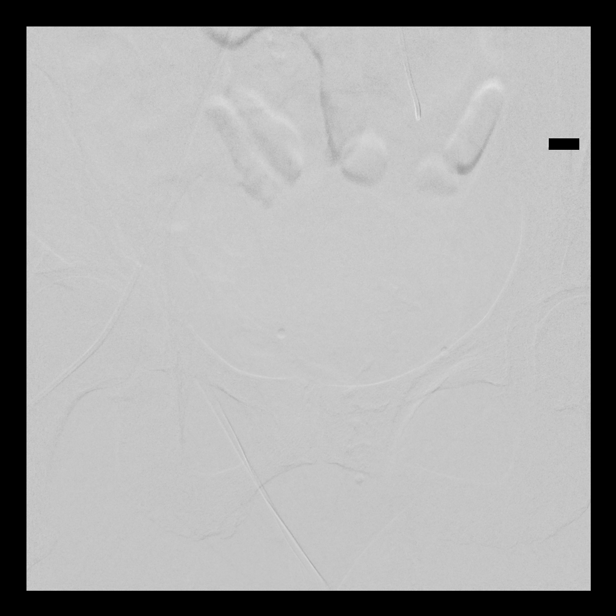

[Series 3: care body 4 · 1 of 21 frames shown (2 of 12)]
[frame 8/21]
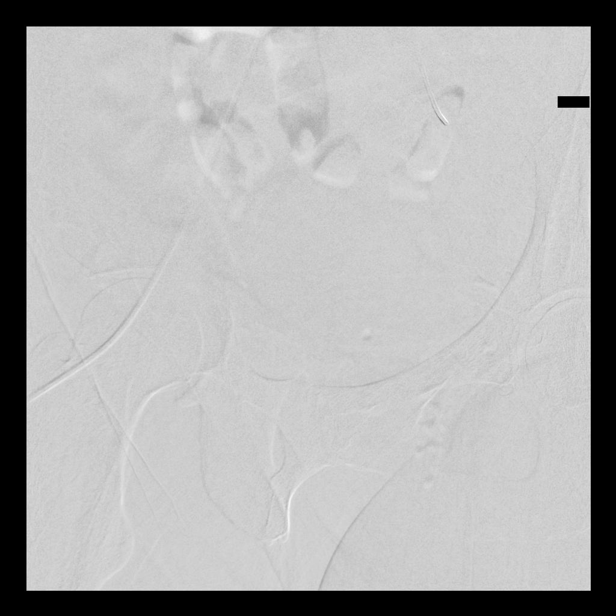

[Series 5: care body 4 · 1 of 29 frames shown (3 of 12)]
[frame 15/29]
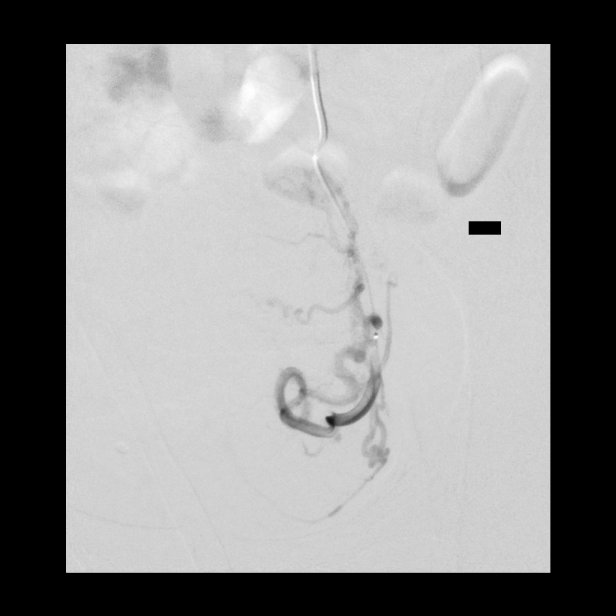

[Series 6: care body 4 · 1 of 20 frames shown (4 of 12)]
[frame 11/20]
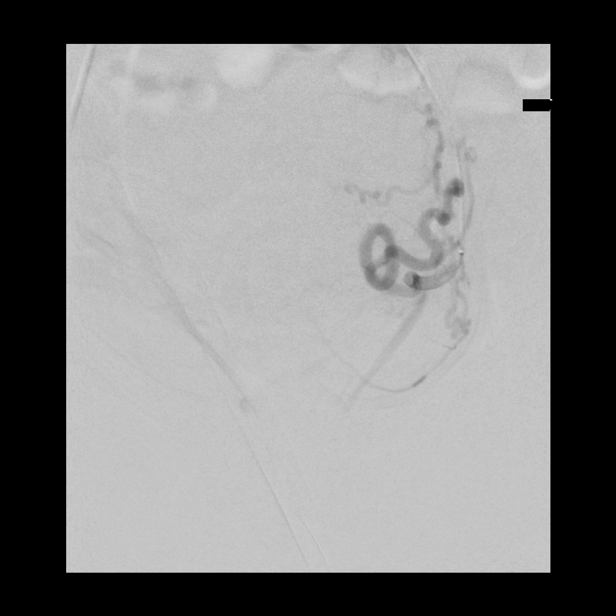

[Series 7: care body 4 · 1 of 31 frames shown (5 of 12)]
[frame 27/31]
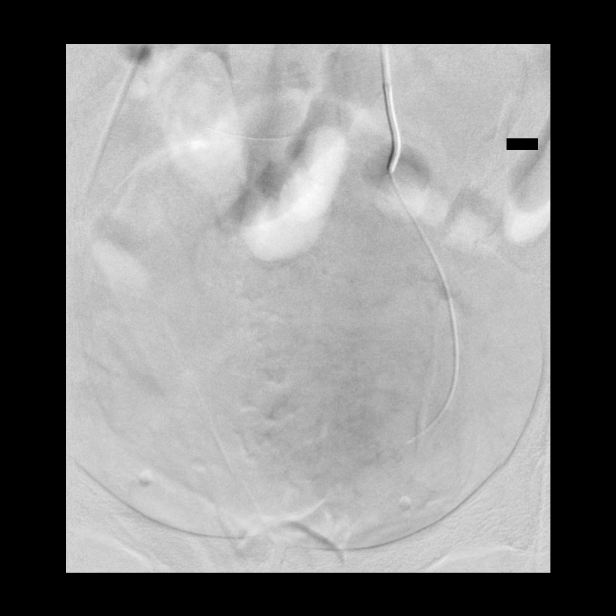

[Series 9: care body 4 · 1 of 18 frames shown (6 of 12)]
[frame 3/18]
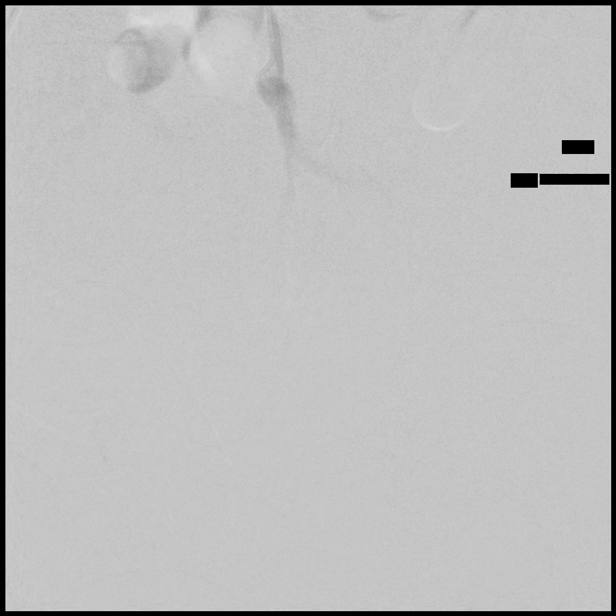

[Series 10: care body 4 · 2 of 17 frames shown (7 of 12)]
[frame 9/17]
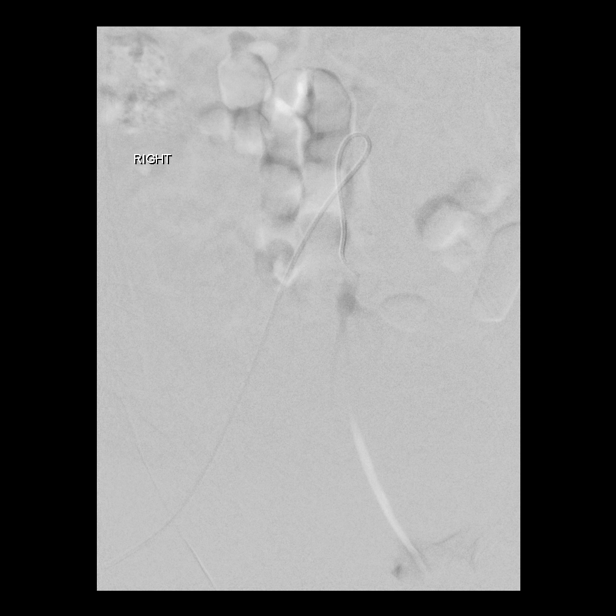
[frame 15/17]
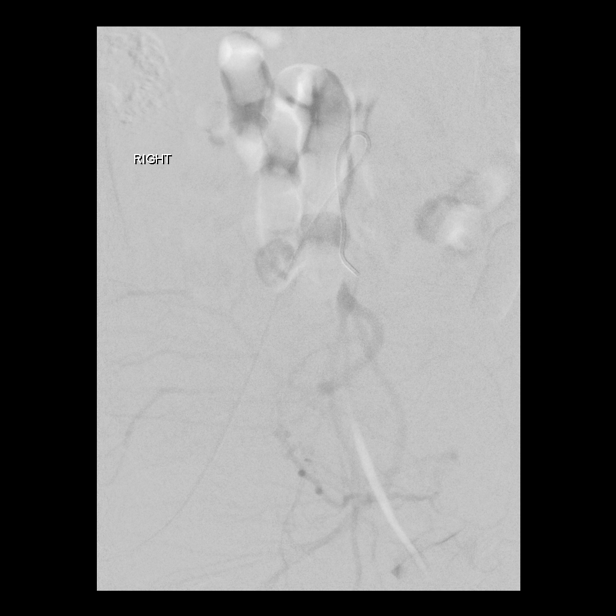

[Series 12: care body 4 · 1 of 22 frames shown (8 of 12)]
[frame 4/22]
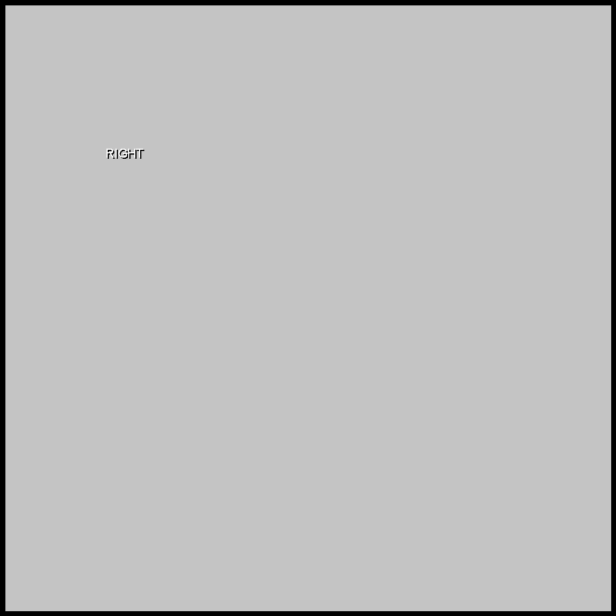

[Series 13: care body 4 · 1 of 25 frames shown (9 of 12)]
[frame 13/25]
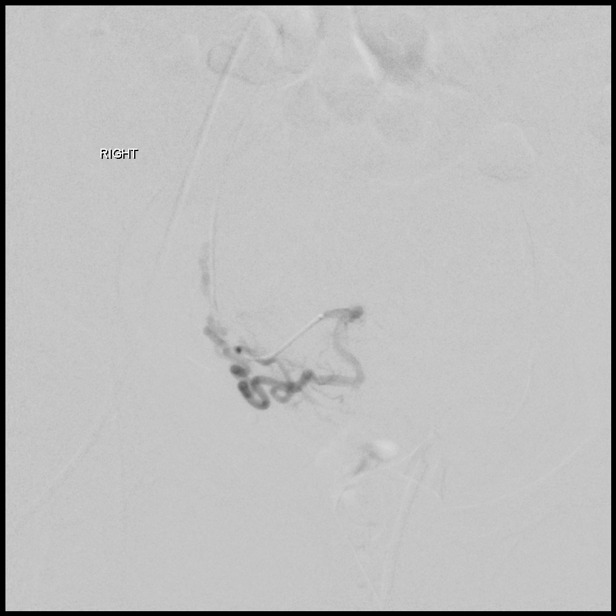

[Series 14: care body 4 · 1 of 24 frames shown (10 of 12)]
[frame 13/24]
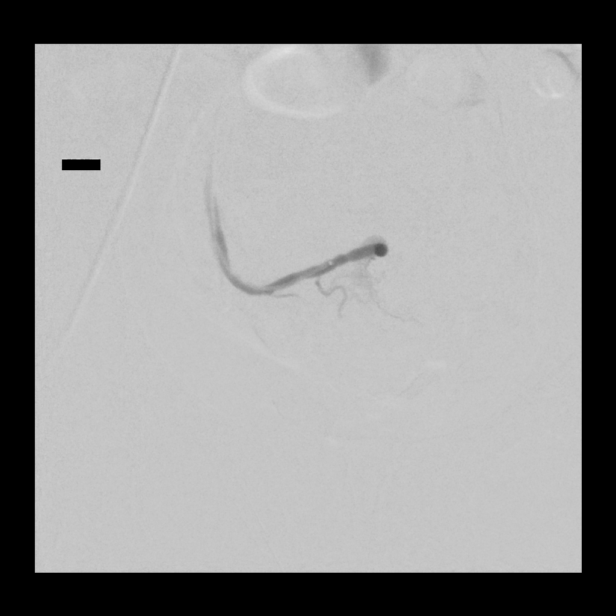

[Series 15: care body 4 · 1 of 10 frames shown (11 of 12)]
[frame 8/10]
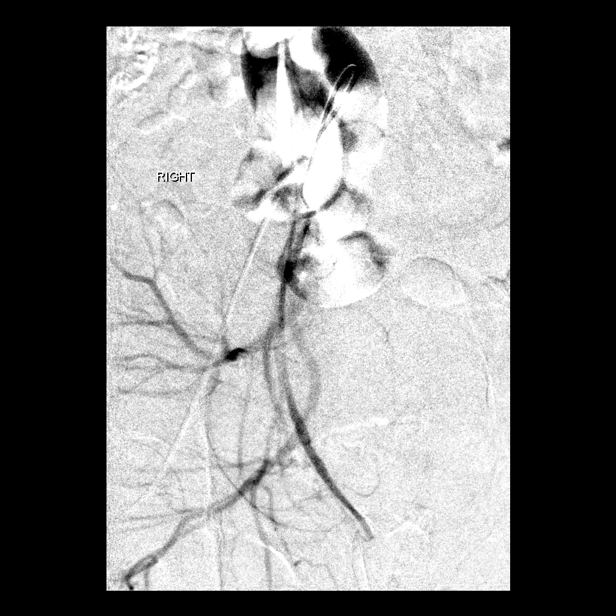

[Series 16: care body 4 · 1 of 12 frames shown (12 of 12)]
[frame 11/12]
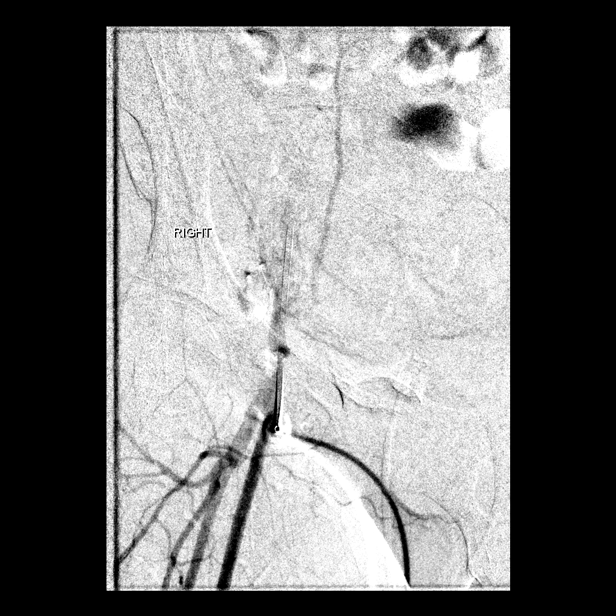

[13 of 24 positions shown; findings below may reference images not displayed]

PROCEDURE(S):  BILATERAL UTERINE ARTERY EMBOLIZATION

Medications: Versed 4 mg, Fentanyl 200 mcg.  Dilaudid 2 mg. Ancef 2
gm. A radiology nurse monitored the patient for moderate sedation.

As antibiotic prophylaxis, Ancef  was ordered pre-procedure and
administered intravenously within one hour of incision.

Moderate sedation time: 95 minutes

Fluoroscopy time: 23.5 minutes

Contrast:  90 ml Zmnipaque-688

Procedure:Informed consent was obtained for the uterine artery
embolization procedure.  The patient was placed supine on the
interventional table.  The patient had palpable pedal pulses.  The
right groin was prepped and draped in a sterile fashion.  Maximal
barrier sterile technique was utilized including caps, mask,
sterile gowns, sterile gloves, sterile drape, hand hygiene and skin
antiseptic.  The skin was anesthetized 1% lidocaine.  Using
ultrasound guidance, 21 gauge needle was directed in the right
common femoral artery and a micropuncture dilator set was placed.
The vascular access was upsized to a 5-French vascular sheath.  A
Cobra catheter was used to cannulate the left common iliac artery
and the left internal iliac artery.  A series of arteriograms were
performed to identify the left uterine artery orfice.  A Progreat
microcatheter was advanced into the left uterine artery.  Two vials
of Embospheres (500 - 700 micron) were injected through the
microcatheter with fluoroscopic guidance.  There was near complete
stasis of the left uterine artery following administration of the
particles.  Udruga Babaic loop was formed using the Cobra catheter
and the right internal iliac artery was cannulated.  The right
uterine artery was identified with contrast angiograms.  The
microcatheter was advanced into the right uterine artery and a
series of angiograms were performed. Two vials of Embospheres (500-
700 micron) were injected through the microcatheter with
fluoroscopic guidance.  There was near complete stasis of the right
uterine artery at the end of the procedure.  The microcatheter was
removed.  The Cobra catheter was straightened out over the aortic
bifurcation and removed over a wire.  Angiogram performed through
the right groin sheath.  The vascular sheath was removed with an
Exoseal closure device . There was hemostasis at the groin and no
evidence for hematoma formation or bleeding.
FINDINGS: Large uterine arteries bilaterally.  Near complete stasis
of the uterine arteries at the end of the procedure. The micro
catheter was advanced beyond a proximal right uterine branch prior
to particle administration.  This proximal right uterine branch was
thought to represent a cervicovaginal branch.  Fluoroscopic images
were obtained for documentation.

Complicatons: None
IMPRESSION: [Successful uterine artery embolization procedure.

## 2013-11-22 ENCOUNTER — Other Ambulatory Visit (HOSPITAL_BASED_OUTPATIENT_CLINIC_OR_DEPARTMENT_OTHER): Payer: Self-pay | Admitting: Obstetrics and Gynecology

## 2013-11-22 DIAGNOSIS — Z1231 Encounter for screening mammogram for malignant neoplasm of breast: Secondary | ICD-10-CM

## 2013-11-27 ENCOUNTER — Ambulatory Visit (HOSPITAL_BASED_OUTPATIENT_CLINIC_OR_DEPARTMENT_OTHER): Payer: 59

## 2013-11-27 ENCOUNTER — Inpatient Hospital Stay (HOSPITAL_BASED_OUTPATIENT_CLINIC_OR_DEPARTMENT_OTHER): Admission: RE | Admit: 2013-11-27 | Payer: 59 | Source: Ambulatory Visit

## 2013-11-28 ENCOUNTER — Other Ambulatory Visit: Payer: Self-pay | Admitting: Internal Medicine

## 2013-11-29 ENCOUNTER — Ambulatory Visit (HOSPITAL_BASED_OUTPATIENT_CLINIC_OR_DEPARTMENT_OTHER)
Admission: RE | Admit: 2013-11-29 | Discharge: 2013-11-29 | Disposition: A | Discharge status: Home / Self Care | Payer: 59 | Source: Ambulatory Visit | Attending: Obstetrics and Gynecology | Admitting: Obstetrics and Gynecology

## 2013-11-29 ENCOUNTER — Inpatient Hospital Stay (HOSPITAL_BASED_OUTPATIENT_CLINIC_OR_DEPARTMENT_OTHER): Admission: RE | Admit: 2013-11-29 | Payer: 59 | Source: Ambulatory Visit

## 2013-11-29 ENCOUNTER — Ambulatory Visit (HOSPITAL_BASED_OUTPATIENT_CLINIC_OR_DEPARTMENT_OTHER): Payer: 59

## 2013-11-29 DIAGNOSIS — Z1231 Encounter for screening mammogram for malignant neoplasm of breast: Secondary | ICD-10-CM | POA: Diagnosis present

## 2014-01-29 ENCOUNTER — Encounter (HOSPITAL_COMMUNITY): Payer: Self-pay

## 2015-03-14 ENCOUNTER — Other Ambulatory Visit: Payer: Self-pay | Admitting: Obstetrics and Gynecology

## 2015-03-29 ENCOUNTER — Encounter (HOSPITAL_COMMUNITY): Payer: Self-pay

## 2015-03-29 ENCOUNTER — Encounter (HOSPITAL_COMMUNITY)
Admission: RE | Admit: 2015-03-29 | Discharge: 2015-03-29 | Disposition: A | Discharge status: Home / Self Care | Payer: 59 | Source: Ambulatory Visit | Attending: Obstetrics and Gynecology | Admitting: Obstetrics and Gynecology

## 2015-03-29 DIAGNOSIS — Z01818 Encounter for other preprocedural examination: Secondary | ICD-10-CM | POA: Diagnosis present

## 2015-03-29 DIAGNOSIS — D259 Leiomyoma of uterus, unspecified: Secondary | ICD-10-CM | POA: Diagnosis not present

## 2015-03-29 LAB — CBC
HEMATOCRIT: 36 % (ref 36.0–46.0)
Hemoglobin: 11 g/dL — ABNORMAL LOW (ref 12.0–15.0)
MCH: 27 pg (ref 26.0–34.0)
MCHC: 30.6 g/dL (ref 30.0–36.0)
MCV: 88.5 fL (ref 78.0–100.0)
Platelets: 349 10*3/uL (ref 150–400)
RBC: 4.07 MIL/uL (ref 3.87–5.11)
RDW: 14.5 % (ref 11.5–15.5)
WBC: 8.8 10*3/uL (ref 4.0–10.5)

## 2015-03-29 LAB — BASIC METABOLIC PANEL
ANION GAP: 7 (ref 5–15)
BUN: 10 mg/dL (ref 6–20)
CO2: 25 mmol/L (ref 22–32)
Calcium: 9.6 mg/dL (ref 8.9–10.3)
Chloride: 106 mmol/L (ref 101–111)
Creatinine, Ser: 0.75 mg/dL (ref 0.44–1.00)
GFR calc Af Amer: >60 mL/min (ref >60)
GFR calc non Af Amer: >60 mL/min (ref >60)
GLUCOSE: 112 mg/dL — AB (ref 65–99)
POTASSIUM: 4.2 mmol/L (ref 3.5–5.1)
SODIUM: 138 mmol/L (ref 135–145)

## 2015-03-29 NOTE — Patient Instructions (Signed)
Your procedure is scheduled on: April 04, 2015  Enter through the Main Entrance of [facility] at: 6:00am   Pick up the phone at the desk and dial [phone].  Call this number if you have problems the morning of surgery: [phone].  Remember: Do NOT eat food: after midnight on Wednesday  Do NOT drink clear liquids after: midnight on Wednesday  Take these medicines the morning of surgery with a SIP OF WATER: none   Do NOT wear jewelry (body piercing), metal hair clips/bobby pins, make-up, or nail polish. Do NOT wear lotions, powders, or perfumes.  You may wear deoderant. Do NOT shave for 48 hours prior to surgery. Do NOT bring valuables to the hospital. Contacts, dentures, or bridgework may not be worn into surgery. Leave suitcase in car.  After surgery it may be brought to your room.  For patients admitted to the hospital, checkout time is 11:00 AM the day of discharge.

## 2015-04-02 ENCOUNTER — Other Ambulatory Visit (HOSPITAL_COMMUNITY): Payer: 59

## 2015-04-03 NOTE — Anesthesia Preprocedure Evaluation (Signed)
Anesthesia Evaluation  Patient identified by MRN, date of birth, ID band Patient awake    Reviewed: Allergy & Precautions, H&P , Patient's Chart, lab work & pertinent test results, reviewed documented beta blocker date and time   Airway Mallampati: II  TM Distance: >3 FB Neck ROM: full    Dental no notable dental hx.    Pulmonary former smoker,    Pulmonary exam normal breath sounds clear to auscultation       Cardiovascular  Rhythm:regular Rate:Normal     Neuro/Psych    GI/Hepatic   Endo/Other  Morbid obesity  Renal/GU      Musculoskeletal   Abdominal   Peds  Hematology   Anesthesia Other Findings   Reproductive/Obstetrics                             Anesthesia Physical Anesthesia Plan  ASA: III  Anesthesia Plan: General   Post-op Pain Management:    Induction: Intravenous  Airway Management Planned: Oral ETT  Additional Equipment:   Intra-op Plan:   Post-operative Plan: Extubation in OR  Informed Consent: I have reviewed the patients History and Physical, chart, labs and discussed the procedure including the risks, benefits and alternatives for the proposed anesthesia with the patient or authorized representative who has indicated his/her understanding and acceptance.   Dental Advisory Given and Dental advisory given  Plan Discussed with: CRNA and Surgeon  Anesthesia Plan Comments: (  Discussed general anesthesia, including possible nausea, instrumentation of airway, sore throat,pulmonary aspiration, etc. I asked if the were any outstanding questions, or  concerns before we proceeded. )        Anesthesia Quick Evaluation

## 2015-04-04 ENCOUNTER — Encounter (HOSPITAL_COMMUNITY)
Admission: AD | Disposition: A | Discharge status: Home / Self Care | Payer: Self-pay | Source: Ambulatory Visit | Attending: Obstetrics and Gynecology

## 2015-04-04 ENCOUNTER — Ambulatory Visit (HOSPITAL_COMMUNITY): Payer: 59 | Admitting: Anesthesiology

## 2015-04-04 ENCOUNTER — Inpatient Hospital Stay (HOSPITAL_COMMUNITY)
Admission: AD | Admit: 2015-04-04 | Discharge: 2015-04-05 | DRG: 743 | Disposition: A | Discharge status: Home / Self Care | Payer: 59 | Source: Ambulatory Visit | Attending: Obstetrics and Gynecology | Admitting: Obstetrics and Gynecology

## 2015-04-04 ENCOUNTER — Encounter (HOSPITAL_COMMUNITY): Payer: Self-pay | Admitting: *Deleted

## 2015-04-04 DIAGNOSIS — E669 Obesity, unspecified: Secondary | ICD-10-CM | POA: Diagnosis present

## 2015-04-04 DIAGNOSIS — Z6838 Body mass index (BMI) 38.0-38.9, adult: Secondary | ICD-10-CM | POA: Diagnosis not present

## 2015-04-04 DIAGNOSIS — N921 Excessive and frequent menstruation with irregular cycle: Secondary | ICD-10-CM | POA: Diagnosis present

## 2015-04-04 DIAGNOSIS — Z90710 Acquired absence of both cervix and uterus: Secondary | ICD-10-CM | POA: Diagnosis present

## 2015-04-04 DIAGNOSIS — D259 Leiomyoma of uterus, unspecified: Secondary | ICD-10-CM | POA: Diagnosis present

## 2015-04-04 HISTORY — PX: ROBOTIC ASSISTED TOTAL HYSTERECTOMY WITH SALPINGECTOMY: SHX6679

## 2015-04-04 HISTORY — PX: CYSTOSCOPY: SHX5120

## 2015-04-04 LAB — CBC
HEMATOCRIT: 32.8 % — AB (ref 36.0–46.0)
Hemoglobin: 10.1 g/dL — ABNORMAL LOW (ref 12.0–15.0)
MCH: 27.2 pg (ref 26.0–34.0)
MCHC: 30.8 g/dL (ref 30.0–36.0)
MCV: 88.4 fL (ref 78.0–100.0)
Platelets: 309 10*3/uL (ref 150–400)
RBC: 3.71 MIL/uL — AB (ref 3.87–5.11)
RDW: 14.7 % (ref 11.5–15.5)
WBC: 14.1 10*3/uL — AB (ref 4.0–10.5)

## 2015-04-04 LAB — BASIC METABOLIC PANEL
ANION GAP: 8 (ref 5–15)
BUN: 8 mg/dL (ref 6–20)
CO2: 24 mmol/L (ref 22–32)
Calcium: 9.3 mg/dL (ref 8.9–10.3)
Chloride: 102 mmol/L (ref 101–111)
Creatinine, Ser: 0.82 mg/dL (ref 0.44–1.00)
GFR calc Af Amer: >60 mL/min (ref >60)
GFR calc non Af Amer: >60 mL/min (ref >60)
GLUCOSE: 164 mg/dL — AB (ref 65–99)
POTASSIUM: 4.9 mmol/L (ref 3.5–5.1)
Sodium: 134 mmol/L — ABNORMAL LOW (ref 135–145)

## 2015-04-04 SURGERY — ROBOTIC ASSISTED TOTAL HYSTERECTOMY WITH SALPINGECTOMY
Anesthesia: General | Site: Bladder | Laterality: Bilateral

## 2015-04-04 MED ORDER — ONDANSETRON HCL 4 MG PO TABS: 4.0000 mg | ORAL_TABLET | Freq: Four times a day (QID) | ORAL | Status: DC | PRN

## 2015-04-04 MED ORDER — ROPIVACAINE HCL 5 MG/ML IJ SOLN
INTRAMUSCULAR | Status: AC
  Filled 2015-04-04: qty 60

## 2015-04-04 MED ORDER — KETOROLAC TROMETHAMINE 60 MG/2ML IM SOLN
INTRAMUSCULAR | Status: DC | PRN
  Administered 2015-04-04: 30 mg via INTRAMUSCULAR

## 2015-04-04 MED ORDER — PROPOFOL 10 MG/ML IV BOLUS
INTRAVENOUS | Status: DC | PRN
  Administered 2015-04-04: 50 mg via INTRAVENOUS
  Administered 2015-04-04: 150 mg via INTRAVENOUS

## 2015-04-04 MED ORDER — VASOPRESSIN 20 UNIT/ML IV SOLN
INTRAVENOUS | Status: AC
  Filled 2015-04-04: qty 1

## 2015-04-04 MED ORDER — PROPOFOL 10 MG/ML IV BOLUS
INTRAVENOUS | Status: AC
  Filled 2015-04-04: qty 20

## 2015-04-04 MED ORDER — LIDOCAINE HCL (CARDIAC) 20 MG/ML IV SOLN
INTRAVENOUS | Status: AC
  Filled 2015-04-04: qty 5

## 2015-04-04 MED ORDER — MIDAZOLAM HCL 5 MG/5ML IJ SOLN
INTRAMUSCULAR | Status: DC | PRN
  Administered 2015-04-04: 2 mg via INTRAVENOUS

## 2015-04-04 MED ORDER — SCOPOLAMINE 1 MG/3DAYS TD PT72
MEDICATED_PATCH | TRANSDERMAL | Status: AC
  Administered 2015-04-04: 1.5 mg via TRANSDERMAL
  Filled 2015-04-04: qty 1

## 2015-04-04 MED ORDER — GLYCOPYRROLATE 0.2 MG/ML IJ SOLN
INTRAMUSCULAR | Status: DC | PRN
  Administered 2015-04-04: 0.6 mg via INTRAVENOUS

## 2015-04-04 MED ORDER — DEXAMETHASONE SODIUM PHOSPHATE 10 MG/ML IJ SOLN
INTRAMUSCULAR | Status: AC
  Filled 2015-04-04: qty 1

## 2015-04-04 MED ORDER — ROCURONIUM BROMIDE 100 MG/10ML IV SOLN
INTRAVENOUS | Status: DC | PRN
  Administered 2015-04-04 (×2): 10 mg via INTRAVENOUS
  Administered 2015-04-04: 50 mg via INTRAVENOUS
  Administered 2015-04-04: 5 mg via INTRAVENOUS
  Administered 2015-04-04: 10 mg via INTRAVENOUS

## 2015-04-04 MED ORDER — ONDANSETRON HCL 4 MG/2ML IJ SOLN
4.0000 mg | Freq: Four times a day (QID) | INTRAMUSCULAR | Status: DC | PRN
  Administered 2015-04-04 (×2): 4 mg via INTRAVENOUS
  Filled 2015-04-04 (×2): qty 2

## 2015-04-04 MED ORDER — HYDROMORPHONE HCL 1 MG/ML IJ SOLN
0.2000 mg | INTRAMUSCULAR | Status: DC | PRN
  Administered 2015-04-04 (×2): 0.2 mg via INTRAVENOUS
  Filled 2015-04-04 (×2): qty 1

## 2015-04-04 MED ORDER — GLYCOPYRROLATE 0.2 MG/ML IJ SOLN
INTRAMUSCULAR | Status: AC
  Filled 2015-04-04: qty 4

## 2015-04-04 MED ORDER — KETOROLAC TROMETHAMINE 30 MG/ML IJ SOLN: 30.0000 mg | Freq: Four times a day (QID) | INTRAMUSCULAR | Status: DC

## 2015-04-04 MED ORDER — NEOSTIGMINE METHYLSULFATE 10 MG/10ML IV SOLN
INTRAVENOUS | Status: AC
  Filled 2015-04-04: qty 1

## 2015-04-04 MED ORDER — HYDROMORPHONE HCL 1 MG/ML IJ SOLN
INTRAMUSCULAR | Status: DC | PRN
  Administered 2015-04-04 (×2): 0.5 mg via INTRAVENOUS

## 2015-04-04 MED ORDER — SUFENTANIL CITRATE 50 MCG/ML IV SOLN
INTRAVENOUS | Status: AC
  Filled 2015-04-04: qty 1

## 2015-04-04 MED ORDER — SODIUM CHLORIDE 0.9 % IJ SOLN
INTRAMUSCULAR | Status: AC
  Filled 2015-04-04: qty 10

## 2015-04-04 MED ORDER — ACETAMINOPHEN 160 MG/5ML PO SOLN
ORAL | Status: AC
  Administered 2015-04-04: 975 mg via ORAL
  Filled 2015-04-04: qty 40.6

## 2015-04-04 MED ORDER — SODIUM CHLORIDE 0.9 % IV SOLN
INTRAVENOUS | Status: DC | PRN
  Administered 2015-04-04: 120 mL

## 2015-04-04 MED ORDER — NEOSTIGMINE METHYLSULFATE 10 MG/10ML IV SOLN
INTRAVENOUS | Status: DC | PRN
  Administered 2015-04-04: 3 mg via INTRAVENOUS

## 2015-04-04 MED ORDER — HYDROMORPHONE HCL 1 MG/ML IJ SOLN: 0.2500 mg | INTRAMUSCULAR | Status: DC | PRN

## 2015-04-04 MED ORDER — IBUPROFEN 800 MG PO TABS
800.0000 mg | ORAL_TABLET | Freq: Three times a day (TID) | ORAL | Status: DC | PRN
  Administered 2015-04-05: 800 mg via ORAL
  Filled 2015-04-04: qty 1

## 2015-04-04 MED ORDER — SODIUM CHLORIDE 0.9 % IJ SOLN
INTRAMUSCULAR | Status: DC | PRN
  Administered 2015-04-04: 10 mL

## 2015-04-04 MED ORDER — DEXAMETHASONE SODIUM PHOSPHATE 10 MG/ML IJ SOLN
INTRAMUSCULAR | Status: DC | PRN
  Administered 2015-04-04: 10 mg via INTRAVENOUS

## 2015-04-04 MED ORDER — BUPIVACAINE HCL (PF) 0.25 % IJ SOLN
INTRAMUSCULAR | Status: DC | PRN
  Administered 2015-04-04: 7 mL

## 2015-04-04 MED ORDER — LIDOCAINE HCL (CARDIAC) 20 MG/ML IV SOLN
INTRAVENOUS | Status: DC | PRN
  Administered 2015-04-04: 50 mg via INTRAVENOUS

## 2015-04-04 MED ORDER — CEFAZOLIN SODIUM-DEXTROSE 2-3 GM-% IV SOLR
INTRAVENOUS | Status: AC
  Filled 2015-04-04: qty 50

## 2015-04-04 MED ORDER — PANTOPRAZOLE SODIUM 40 MG PO TBEC
40.0000 mg | DELAYED_RELEASE_TABLET | Freq: Every day | ORAL | Status: DC
  Administered 2015-04-04 – 2015-04-05 (×2): 40 mg via ORAL
  Filled 2015-04-04 (×2): qty 1

## 2015-04-04 MED ORDER — ONDANSETRON HCL 4 MG/2ML IJ SOLN
INTRAMUSCULAR | Status: AC
  Filled 2015-04-04: qty 2

## 2015-04-04 MED ORDER — KETOROLAC TROMETHAMINE 30 MG/ML IJ SOLN
INTRAMUSCULAR | Status: DC | PRN
  Administered 2015-04-04: 30 mg via INTRAVENOUS

## 2015-04-04 MED ORDER — MENTHOL 3 MG MT LOZG: 1.0000 | LOZENGE | OROMUCOSAL | Status: DC | PRN

## 2015-04-04 MED ORDER — ONDANSETRON HCL 4 MG/2ML IJ SOLN
INTRAMUSCULAR | Status: DC | PRN
  Administered 2015-04-04: 4 mg via INTRAVENOUS

## 2015-04-04 MED ORDER — MIDAZOLAM HCL 2 MG/2ML IJ SOLN
INTRAMUSCULAR | Status: AC
  Filled 2015-04-04: qty 2

## 2015-04-04 MED ORDER — KETOROLAC TROMETHAMINE 30 MG/ML IJ SOLN
INTRAMUSCULAR | Status: AC
  Filled 2015-04-04: qty 2

## 2015-04-04 MED ORDER — KETOROLAC TROMETHAMINE 30 MG/ML IJ SOLN
30.0000 mg | Freq: Four times a day (QID) | INTRAMUSCULAR | Status: DC
  Administered 2015-04-04 (×2): 30 mg via INTRAVENOUS
  Filled 2015-04-04 (×2): qty 1

## 2015-04-04 MED ORDER — ACETAMINOPHEN 160 MG/5ML PO SOLN
975.0000 mg | Freq: Once | ORAL | Status: AC
  Administered 2015-04-04: 975 mg via ORAL

## 2015-04-04 MED ORDER — CEFAZOLIN SODIUM-DEXTROSE 2-3 GM-% IV SOLR
2.0000 g | INTRAVENOUS | Status: AC
  Administered 2015-04-04: 2 g via INTRAVENOUS

## 2015-04-04 MED ORDER — DEXTROSE IN LACTATED RINGERS 5 % IV SOLN
INTRAVENOUS | Status: DC
  Administered 2015-04-04: 12:00:00 via INTRAVENOUS

## 2015-04-04 MED ORDER — HYDROMORPHONE HCL 1 MG/ML IJ SOLN
INTRAMUSCULAR | Status: AC
  Filled 2015-04-04: qty 1

## 2015-04-04 MED ORDER — ARTIFICIAL TEARS OP OINT
TOPICAL_OINTMENT | OPHTHALMIC | Status: DC | PRN
  Administered 2015-04-04: 1 via OPHTHALMIC

## 2015-04-04 MED ORDER — SODIUM CHLORIDE 0.9 % IJ SOLN
INTRAMUSCULAR | Status: AC
  Filled 2015-04-04: qty 50

## 2015-04-04 MED ORDER — BUPIVACAINE HCL (PF) 0.25 % IJ SOLN
INTRAMUSCULAR | Status: AC
  Filled 2015-04-04: qty 30

## 2015-04-04 MED ORDER — SCOPOLAMINE 1 MG/3DAYS TD PT72
1.0000 | MEDICATED_PATCH | Freq: Once | TRANSDERMAL | Status: DC
  Administered 2015-04-04: 1.5 mg via TRANSDERMAL

## 2015-04-04 MED ORDER — LACTATED RINGERS IR SOLN
Status: DC | PRN
Start: 2015-04-04 — End: 2015-04-04
  Administered 2015-04-04: 3000 mL

## 2015-04-04 MED ORDER — SUFENTANIL CITRATE 50 MCG/ML IV SOLN
INTRAVENOUS | Status: DC | PRN
  Administered 2015-04-04: 15 ug via INTRAVENOUS
  Administered 2015-04-04 (×2): 10 ug via INTRAVENOUS
  Administered 2015-04-04: 5 ug via INTRAVENOUS
  Administered 2015-04-04: 10 ug via INTRAVENOUS

## 2015-04-04 MED ORDER — SIMETHICONE 80 MG PO CHEW
80.0000 mg | CHEWABLE_TABLET | Freq: Four times a day (QID) | ORAL | Status: DC | PRN
  Administered 2015-04-04 – 2015-04-05 (×3): 80 mg via ORAL
  Filled 2015-04-04 (×3): qty 1

## 2015-04-04 MED ORDER — LACTATED RINGERS IV SOLN: INTRAVENOUS | Status: DC

## 2015-04-04 MED ORDER — OXYCODONE-ACETAMINOPHEN 5-325 MG PO TABS: 1.0000 | ORAL_TABLET | ORAL | Status: DC | PRN

## 2015-04-04 MED ORDER — LACTATED RINGERS IV SOLN
INTRAVENOUS | Status: DC
  Administered 2015-04-04 (×2): via INTRAVENOUS

## 2015-04-04 SURGICAL SUPPLY — 52 items
BARRIER ADHS 3X4 INTERCEED (GAUZE/BANDAGES/DRESSINGS) IMPLANT
CATH FOLEY 3WAY  5CC 16FR (CATHETERS) ×2
CATH FOLEY 3WAY 5CC 16FR (CATHETERS) ×2 IMPLANT
CLOTH BEACON ORANGE TIMEOUT ST (SAFETY) ×4 IMPLANT
CONT PATH 16OZ SNAP LID 3702 (MISCELLANEOUS) ×4 IMPLANT
COVER BACK TABLE 60X90IN (DRAPES) ×8 IMPLANT
COVER TIP SHEARS 8 DVNC (MISCELLANEOUS) ×2 IMPLANT
COVER TIP SHEARS 8MM DA VINCI (MISCELLANEOUS) ×2
DECANTER SPIKE VIAL GLASS SM (MISCELLANEOUS) ×4 IMPLANT
DRSG OPSITE POSTOP 3X4 (GAUZE/BANDAGES/DRESSINGS) ×4 IMPLANT
DURAPREP 26ML APPLICATOR (WOUND CARE) ×4 IMPLANT
ELECT REM PT RETURN 9FT ADLT (ELECTROSURGICAL) ×4
ELECTRODE REM PT RTRN 9FT ADLT (ELECTROSURGICAL) ×2 IMPLANT
GAUZE VASELINE 3X9 (GAUZE/BANDAGES/DRESSINGS) IMPLANT
GLOVE BIOGEL PI IND STRL 7.0 (GLOVE) ×14 IMPLANT
GLOVE BIOGEL PI INDICATOR 7.0 (GLOVE) ×14
GLOVE ECLIPSE 6.5 STRL STRAW (GLOVE) ×8 IMPLANT
KIT ACCESSORY DA VINCI DISP (KITS) ×2
KIT ACCESSORY DVNC DISP (KITS) ×2 IMPLANT
LEGGING LITHOTOMY PAIR STRL (DRAPES) ×4 IMPLANT
LIQUID BAND (GAUZE/BANDAGES/DRESSINGS) ×4 IMPLANT
NEEDLE INSUFFLATION 150MM (ENDOMECHANICALS) ×4 IMPLANT
OCCLUDER COLPOPNEUMO (BALLOONS) ×4 IMPLANT
PACK ROBOT WH (CUSTOM PROCEDURE TRAY) ×4 IMPLANT
PACK ROBOTIC GOWN (GOWN DISPOSABLE) ×4 IMPLANT
PAD POSITIONING PINK XL (MISCELLANEOUS) ×4 IMPLANT
PAD PREP 24X48 CUFFED NSTRL (MISCELLANEOUS) ×8 IMPLANT
PORT ACCESS TROCAR AIRSEAL 12 (TROCAR) ×2 IMPLANT
PORT ACCESS TROCAR AIRSEAL 5M (TROCAR) ×2
SCISSORS LAP 5X35 DISP (ENDOMECHANICALS) IMPLANT
SET CYSTO W/LG BORE CLAMP LF (SET/KITS/TRAYS/PACK) ×4 IMPLANT
SET IRRIG TUBING LAPAROSCOPIC (IRRIGATION / IRRIGATOR) ×4 IMPLANT
SET TRI-LUMEN FLTR TB AIRSEAL (TUBING) ×4 IMPLANT
SLEEVE XCEL OPT CAN 5 100 (ENDOMECHANICALS) ×4 IMPLANT
SUT VIC AB 0 CT1 36 (SUTURE) ×8 IMPLANT
SUT VICRYL 0 UR6 27IN ABS (SUTURE) ×8 IMPLANT
SUT VICRYL 4-0 PS2 18IN ABS (SUTURE) ×20 IMPLANT
SUT VLOC 180 0 9IN  GS21 (SUTURE) ×4
SUT VLOC 180 0 9IN GS21 (SUTURE) ×4 IMPLANT
SYR 50ML LL SCALE MARK (SYRINGE) ×4 IMPLANT
SYRINGE 10CC LL (SYRINGE) ×4 IMPLANT
TIP RUMI ORANGE 6.7MMX12CM (TIP) ×4 IMPLANT
TIP UTERINE 5.1X6CM LAV DISP (MISCELLANEOUS) IMPLANT
TIP UTERINE 6.7X10CM GRN DISP (MISCELLANEOUS) IMPLANT
TIP UTERINE 6.7X6CM WHT DISP (MISCELLANEOUS) IMPLANT
TIP UTERINE 6.7X8CM BLUE DISP (MISCELLANEOUS) IMPLANT
TOWEL OR 17X24 6PK STRL BLUE (TOWEL DISPOSABLE) ×12 IMPLANT
TROCAR DISP BLADELESS 8 DVNC (TROCAR) ×2 IMPLANT
TROCAR DISP BLADELESS 8MM (TROCAR) ×2
TROCAR PORT AIRSEAL 8X120 (TROCAR) IMPLANT
TROCAR Z-THREAD 12X150 (TROCAR) ×4 IMPLANT
WATER STERILE IRR 1000ML POUR (IV SOLUTION) ×12 IMPLANT

## 2015-04-04 NOTE — Op Note (Signed)
Katrina Waters, Katrina Waters           ACCOUNT NO.:  [id number]  MEDICAL RECORD NO.:  [id number]  LOCATION:  9306                          FACILITY:  [facility]  PHYSICIAN:  Servando Salina, M.D.DATE OF BIRTH:  06/04/1970  DATE OF PROCEDURE:  04/04/2015 DATE OF DISCHARGE:                              OPERATIVE REPORT   PREOPERATIVE DIAGNOSIS:  Symptomatic uterine fibroids, hx UFE.  PROCEDURE:  Research officer, trade [employer] robotic total hysterectomy, bilateral salpingectomy, cystoscopy.  POSTOPERATIVE DIAGNOSIS:  Symptomatic uterine , Hx UFE.  ANESTHESIA:  General.  SURGEON:  Servando Salina, MD  ASSISTANT:  Laury Deep, CNM  DESCRIPTION OF PROCEDURE:  Under adequate general anesthesia, the patient was positioned for the robotic surgery.  She was placed in a dorsal lithotomy position.  Examination under anesthesia revealed anteverted irregular uterus about 12 weeks size.  No adnexal masses could be appreciated, but limited by the patient's body habitus.  The patient was sterilely prepped and draped in usual fashion.  A 3-way Foley catheter was sterilely placed.  A weighted speculum was placed in the vagina.  Sims retractors placed anteriorly.   Parous cervix was noted, 0 Vicryl figure-of-eight sutures placed on the anterior and posterior lip of the cervix.  The uterus sounded to 12 cm.  A #12 uterine manipulator with a medium RUMI cup was introduced into the uterine cavity  for manipulation of the uterus.  The retractors were removed.  Attention was then turned to the abdomen.  Supraumbilical incision was made after 0.25% Marcaine was injected.  Veress needle was introduced.  Opening pressure of 7 was noted.  3 L of CO2 was insufflated.  The Veress needle was then removed and a 12 mm trocar was introduced into the abdomen without incident.  The robotic camera was then placed through that port.  Panoramic inspection was noted.  Normal liver edge was seen.  A fibroid uterus was noted.  Adnexa  without any masses noted.  Two robotic ports were placed on the left, one 8 mm apart.  The right lower quadrant had a 12 mm AirSeal port and in between the camera and that AirSeal port was another robotic port site placement.  Once this was done under direct visualization, the robot was docked to the patient's left side.  In arm #1, the monopolar scissors; arm #2, the PK dissector; and in arm #3, the Prograsp.  When all instruments were in, I then went to the surgical console.  At the console, the pelvis was inspected.  Ureters were identified bilaterally. The procedure was started on the left by deviating the uterus using the 3rd arm.  The retroperitoneal space on the left was opened.  The fallopian tube was brought up in the field and the mesosalpinx was serially clamped, cauterized, and then cut with removal of that tube. At that point, the utero-ovarian ligament was then isolated, clamped, cauterized, and cut.  The posterior leaf of the broad ligament was then opened and carried down inferiorly.  The round ligament was then clamped, cauterized, and then cut.  The anterior leaf of the broad ligament then carried around until and across the vesicouterine peritoneum with the bladder being displaced inferiorly.  The uterine vessels were tortuous, the were  serially clamped multiple times, cauterized, and then cut.  The posterior leaf of the broad ligament continued to be pushed down inferiorly.  On the contralateral side, similar procedure was performed with that again the fallopian tube being removed.  After the serial clamping of the mesosalpinx, opening of the retroperitoneal space, identifying the right utero-ovarian ligament, clamped, cauterized, and cut followed by the round ligament and completing the opening of the vesicouterine peritoneum with the space of the bladder inferiorly.  The uterine vessels were identified.  They were skeletonized, serially clamped, cauterized, and then  cut.  At that point, the insufflator was insufflated and the bladder was further displaced inferiorly.  A curvilinear incision was then made at the cervicovaginal junction with removal of the uterus from its vaginal attachment.  Subsequently, this specimen was able to be removed through the vagina.  The bleeders on the vaginal cuff was carefully cauterized. The bladder was further dissected inferiorly.  The instruments were then replaced using long-tipped forceps in the arm #2 and a large mega suture needle driver in arm #1 and replacing the ProGrasp with a PK dissector, 0 V-Loc x2 was used to close the vaginal cuff. Intraoperatively, the vaginal cuff was digitally palpated with good approximation noted.  Due to some bulge in the posterior right aspect of the vaginal cuff, the decision was then made to perform a cystoscopy. At that point, the three-way Foley was removed.  The cystoscope with a wide angle 7 degree was inserted.  Both ureters were identified with ureteral jets putting out urine bilaterally.  At that point, the cystoscope was removed.  The 3-way Foley was then sterilely reinserted, I went back to the surgical console, irrigated the pelvis, inspected the adnexa.  No further areas of concerns was noted.  The abdomen was partially deflated, and any small bleeders identified were cauterized and the procedure was then felt to be complete at which time, the robot was undocked.  The instruments had been removed when I went to do the cystoscope.  I then went back to the patient's bedside sterilely. The  pelvis was inspected.  Ropivacaine was injected for pain management and the robotic ports were removed under direct visualization and the AirSeal removed lastly.  The incision was then closed, identified the fascia in the biggest incisions and using 0 Vicryl figure-of-eight sutures for those areas and 4-0 Vicryl for subcuticular closure.  The pelvis was inspected by me again with  good approximated vaginal cuff.  SPECIMEN:  Uterus with cervix and tubes and tubes sent to Pathology.  ESTIMATED BLOOD LOSS:  75 mL.  INTRAOPERATIVE FLUIDS:  1700 mL.  URINE OUTPUT:  325 mL clear yellow urine.  COUNTS:  Sponge and instrument counts x2 was correct.  COMPLICATION:  None.  The patient tolerated the procedure well, was transferred to the recovery room in stable condition.     Servando Salina, M.D.     [place]/MEDQ  D:  04/04/2015  T:  04/04/2015  Job:  [id number]

## 2015-04-04 NOTE — Anesthesia Postprocedure Evaluation (Signed)
Anesthesia Post Note  Patient: Katrina Waters  Procedure(s) Performed: Procedure(s) (LRB): ROBOTIC ASSISTED TOTAL HYSTERECTOMY WITH BILATERAL SALPINGECTOMY (Bilateral) CYSTOSCOPY  Patient location during evaluation: PACU Anesthesia Type: General Level of consciousness: sedated Pain management: satisfactory to patient Vital Signs Assessment: post-procedure vital signs reviewed and stable Respiratory status: spontaneous breathing Cardiovascular status: stable Anesthetic complications: no    Last Vitals:  Filed Vitals:   04/04/15 1250 04/04/15 1350  BP: 117/64 108/67  Pulse: 94 88  Temp: 36.7 C 36.8 C  Resp: 20 20    Last Pain: There were no vitals filed for this visit.               Riccardo Dubin

## 2015-04-04 NOTE — Anesthesia Procedure Notes (Signed)
Procedure Name: Intubation Date/Time: 04/04/2015 7:33 AM Performed by: Ignacia Bayley Pre-anesthesia Checklist: Patient identified, Emergency Drugs available, Suction available and Patient being monitored Patient Re-evaluated:Patient Re-evaluated prior to inductionOxygen Delivery Method: Circle system utilized Preoxygenation: Pre-oxygenation with 100% oxygen Intubation Type: IV induction Ventilation: Mask ventilation without difficulty Grade View: Grade III Tube type: Oral Tube size: 7.0 mm Number of attempts: 2 Airway Equipment and Method: Video-laryngoscopy and Rigid stylet Placement Confirmation: ETT inserted through vocal cords under direct vision,  positive ETCO2 and breath sounds checked- equal and bilateral Secured at: 20 cm Tube secured with: Tape Dental Injury: Teeth and Oropharynx as per pre-operative assessment  Difficulty Due To: Difficult Airway- due to anterior larynx and Difficult Airway- due to limited oral opening

## 2015-04-04 NOTE — Brief Op Note (Signed)
04/04/2015  11:00 AM  PATIENT:  Katrina Waters  45 y.o. female  PRE-OPERATIVE DIAGNOSIS:  Symptomatic Uterine Fibroids , hx UFE  POST-OPERATIVE DIAGNOSIS:  symptomatic uterine fibroids, hx UFE  PROCEDURE:  Da vinci robotic total hysterectomy, bilateral salpingectomy, cystoscopy  SURGEON:  Surgeon(s) and Role:    * Servando Salina, MD - Primary  PHYSICIAN ASSISTANT:   ASSISTANTS: Laury Deep, CNM   ANESTHESIA:   general Findings: fibroid uterus, nl tubes, nl ureter, nl ovaries EBL:  Total I/O In: 2200 [I.V.:2200] Out: 400 [Urine:325; Blood:75]  BLOOD ADMINISTERED:none  DRAINS: none   LOCAL MEDICATIONS USED:  MARCAINE    and OTHER ropivacaine  SPECIMEN:  Source of Specimen:  uterus with cervix, tubes  DISPOSITION OF SPECIMEN:  PATHOLOGY  COUNTS:  YES  TOURNIQUET:  * No tourniquets in log *  DICTATION: .Other Dictation: Dictation Number [id number]  PLAN OF CARE: Admit for overnight observation  PATIENT DISPOSITION:  PACU - hemodynamically stable.   Delay start of Pharmacological VTE agent (>24hrs) due to surgical blood loss or risk of bleeding: no

## 2015-04-04 NOTE — Anesthesia Postprocedure Evaluation (Signed)
Anesthesia Post Note  Patient: Katrina Waters  Procedure(s) Performed: Procedure(s) (LRB): ROBOTIC ASSISTED TOTAL HYSTERECTOMY WITH BILATERAL SALPINGECTOMY (Bilateral) CYSTOSCOPY  Patient location during evaluation: [facility] Anesthesia Type: General Level of consciousness: awake and alert Pain management: satisfactory to patient Vital Signs Assessment: post-procedure vital signs reviewed and stable Respiratory status: spontaneous breathing and respiratory function stable Cardiovascular status: stable Postop Assessment: adequate PO intake Anesthetic complications: no    Last Vitals:  Filed Vitals:   04/04/15 1215 04/04/15 1250  BP:  117/64  Pulse: 89 94  Temp:  36.7 C  Resp: 20 20    Last Pain: There were no vitals filed for this visit.               Neri Samek

## 2015-04-04 NOTE — Transfer of Care (Signed)
Immediate Anesthesia Transfer of Care Note  Patient: Katrina Waters  Procedure(s) Performed: Procedure(s) with comments: ROBOTIC ASSISTED TOTAL HYSTERECTOMY WITH BILATERAL SALPINGECTOMY (Bilateral) - 3 hrs. CYSTOSCOPY  Patient Location: PACU  Anesthesia Type:General  Level of Consciousness: sedated  Airway & Oxygen Therapy: Patient Spontanous Breathing and Patient connected to nasal cannula oxygen  Post-op Assessment: Report given to RN and Post -op Vital signs reviewed and stable  Post vital signs: stable  Last Vitals:  Filed Vitals:   04/04/15 0556  BP: 131/94  Pulse: 93  Temp: 36.6 C  Resp: 20    Complications: No apparent anesthesia complications

## 2015-04-05 ENCOUNTER — Encounter (HOSPITAL_COMMUNITY): Payer: Self-pay | Admitting: Obstetrics and Gynecology

## 2015-04-05 MED ORDER — OXYCODONE-ACETAMINOPHEN 5-325 MG PO TABS: 1.0000 | ORAL_TABLET | ORAL | Status: AC | PRN

## 2015-04-05 MED ORDER — IBUPROFEN 800 MG PO TABS: 800.0000 mg | ORAL_TABLET | Freq: Three times a day (TID) | ORAL | Status: AC | PRN

## 2015-04-05 NOTE — Discharge Summary (Signed)
Physician Discharge Summary  Patient ID: Katrina Waters MRN: [id number] DOB/AGE: December 13, 1970 45 y.o.  Admit date: 04/04/2015 Discharge date: 04/05/2015  Admission Diagnoses: symptomatic uterine fibroids, hx UFE  Discharge Diagnoses: same  Active Problems:   S/P hysterectomy   Discharged Condition: stable  Hospital Course: pt underwent davinci robotic total hysterectomy, bilateral salpingectomy. Uncomplicated postop course  Consults: None  Significant Diagnostic Studies: labs:  CBC    Component Value Date/Time   WBC 14.1* 04/04/2015 1806   RBC 3.71* 04/04/2015 1806   HGB 10.1* 04/04/2015 1806   HCT 32.8* 04/04/2015 1806   PLT 309 04/04/2015 1806   MCV 88.4 04/04/2015 1806   MCH 27.2 04/04/2015 1806   MCHC 30.8 04/04/2015 1806   RDW 14.7 04/04/2015 1806   LYMPHSABS 2.1 12/03/2011 0811   MONOABS 0.4 12/03/2011 0811   EOSABS 0.1 12/03/2011 0811   BASOSABS 0.0 12/03/2011 0811    Lab Results  Component Value Date   CREATININE 0.82 04/04/2015   CREATININE 0.75 03/29/2015   CREATININE 0.72 12/03/2011    Treatments: surgery: davinci robotic total hysterectomy, cystoscopy, bilateral salpingectomy  Discharge Exam: Blood pressure 124/80, pulse 97, temperature 98.4 F (36.9 C), temperature source Oral, resp. rate 18, height 5\' 5"  (1.651 m), weight 104.327 kg (230 lb), SpO2 100 %. General appearance: alert, cooperative and no distress Back: no tenderness to percussion or palpation Resp: clear to auscultation bilaterally Cardio: regular rate and rhythm, S1, S2 normal, no murmur, click, rub or gallop Pelvic: deferred Extremities: no edema, redness or tenderness in the calves or thighs Skin: Skin color, texture, turgor normal. No rashes or lesions  Disposition: 01-Home or Self Care  Discharge Instructions    Diet - low sodium heart healthy    Complete by:  As directed      Discharge instructions    Complete by:  As directed   Call if temperature greater than equal to  100.4, nothing per vagina for 4-6 weeks or severe nausea vomiting, increased incisional pain , drainage or redness in the incision site, no straining with bowel movements, showers no bath     Discharge patient    Complete by:  As directed      Increase activity slowly    Complete by:  As directed      May walk up steps    Complete by:  As directed      No wound care    Complete by:  As directed             Medication List    TAKE these medications        ibuprofen 800 MG tablet  Commonly known as:  ADVIL,MOTRIN  Take 1 tablet (800 mg total) by mouth every 8 (eight) hours as needed (mild pain).     oxyCODONE-acetaminophen 5-325 MG tablet  Commonly known as:  PERCOCET/ROXICET  Take 1-2 tablets by mouth every 4 (four) hours as needed for severe pain (moderate to severe pain (when tolerating fluids)).           Follow-up Information    Follow up with Djon Tith A, MD In 2 weeks.   Specialty:  Obstetrics and Gynecology   Contact information:   [address] [place] [address] [phone]       Signed: Madox Corkins A 04/05/2015, 8:16 AM

## 2015-04-05 NOTE — Discharge Instructions (Signed)
Call if temperature greater than equal to 100.4, nothing per vagina for 4-6 weeks or severe nausea vomiting, increased incisional pain , drainage or redness in the incision site, no straining with bowel movements, showers no bath °

## 2015-04-05 NOTE — Progress Notes (Signed)
Subjective: Patient reports tolerating PO, + flatus and no problems voiding . C/o gas pain . notes some left back pain  Which pt relates to gas as it stopped when she moved around.    Objective: I have reviewed patient's vital signs.  vital signs, intake and output and labs. Filed Vitals:   04/05/15 0126 04/05/15 0501  BP: 107/57 124/80  Pulse: 102 97  Temp: 98.7 F (37.1 C) 98.4 F (36.9 C)  Resp: 14 18   I/O last 3 completed shifts: In: 5781.3 [P.O.:1800; I.V.:3981.3] Out: [id number] [Urine:3650; Blood:75]    Lab Results  Component Value Date   WBC 14.1* 04/04/2015   HGB 10.1* 04/04/2015   HCT 32.8* 04/04/2015   MCV 88.4 04/04/2015   PLT 309 04/04/2015   Lab Results  Component Value Date   CREATININE 0.82 04/04/2015    EXAM General: alert, cooperative and no distress Resp: clear to auscultation bilaterally Cardio: regular rate and rhythm, S1, S2 normal, no murmur, click, rub or gallop GI: soft, non-tender; bowel sounds normal; no masses,  no organomegaly and incision: clean, dry and intact Extremities: no edema, redness or tenderness in the calves or thighs Vagina" no blood Back no CVAT Assessment: s/p Procedure(s): ROBOTIC ASSISTED TOTAL HYSTERECTOMY WITH BILATERAL SALPINGECTOMY CYSTOSCOPY: stable, progressing well, tolerating diet and anemia  Plan: Encourage ambulation Discontinue IV fluids Discharge home  D/c instructions reviewed F/u 2 weeks  LOS: 1 day    Charlea Nardo A, MD 04/05/2015 8:10 AM    04/05/2015, 8:10 AM

## 2015-04-05 NOTE — Progress Notes (Signed)
Pt teaching  Complete   Ambulated out  With tech  And family

## 2017-03-11 ENCOUNTER — Other Ambulatory Visit (HOSPITAL_BASED_OUTPATIENT_CLINIC_OR_DEPARTMENT_OTHER): Payer: Self-pay | Admitting: Obstetrics and Gynecology

## 2017-03-11 DIAGNOSIS — Z1231 Encounter for screening mammogram for malignant neoplasm of breast: Secondary | ICD-10-CM

## 2017-03-16 ENCOUNTER — Encounter (HOSPITAL_BASED_OUTPATIENT_CLINIC_OR_DEPARTMENT_OTHER): Payer: Self-pay | Admitting: Radiology

## 2017-03-16 ENCOUNTER — Ambulatory Visit (HOSPITAL_BASED_OUTPATIENT_CLINIC_OR_DEPARTMENT_OTHER)
Admission: RE | Admit: 2017-03-16 | Discharge: 2017-03-16 | Disposition: A | Discharge status: Home / Self Care | Payer: 59 | Source: Ambulatory Visit | Attending: Obstetrics and Gynecology | Admitting: Obstetrics and Gynecology

## 2017-03-16 DIAGNOSIS — Z1231 Encounter for screening mammogram for malignant neoplasm of breast: Secondary | ICD-10-CM | POA: Insufficient documentation

## 2018-02-18 ENCOUNTER — Other Ambulatory Visit (HOSPITAL_BASED_OUTPATIENT_CLINIC_OR_DEPARTMENT_OTHER): Payer: Self-pay | Admitting: Family Medicine

## 2018-02-18 DIAGNOSIS — Z1231 Encounter for screening mammogram for malignant neoplasm of breast: Secondary | ICD-10-CM

## 2018-03-17 ENCOUNTER — Ambulatory Visit (HOSPITAL_BASED_OUTPATIENT_CLINIC_OR_DEPARTMENT_OTHER): Payer: 59

## 2018-03-21 ENCOUNTER — Ambulatory Visit (HOSPITAL_BASED_OUTPATIENT_CLINIC_OR_DEPARTMENT_OTHER)
Admission: RE | Admit: 2018-03-21 | Discharge: 2018-03-21 | Disposition: A | Discharge status: Home / Self Care | Payer: 59 | Source: Ambulatory Visit | Attending: Family Medicine | Admitting: Family Medicine

## 2018-03-21 DIAGNOSIS — Z1231 Encounter for screening mammogram for malignant neoplasm of breast: Secondary | ICD-10-CM | POA: Insufficient documentation

## 2019-03-21 ENCOUNTER — Other Ambulatory Visit (HOSPITAL_BASED_OUTPATIENT_CLINIC_OR_DEPARTMENT_OTHER): Payer: Self-pay | Admitting: Family Medicine

## 2019-03-21 DIAGNOSIS — Z1231 Encounter for screening mammogram for malignant neoplasm of breast: Secondary | ICD-10-CM

## 2019-03-27 ENCOUNTER — Ambulatory Visit (HOSPITAL_BASED_OUTPATIENT_CLINIC_OR_DEPARTMENT_OTHER): Payer: 59

## 2019-03-29 ENCOUNTER — Ambulatory Visit (HOSPITAL_BASED_OUTPATIENT_CLINIC_OR_DEPARTMENT_OTHER): Payer: 59

## 2019-03-30 ENCOUNTER — Ambulatory Visit (HOSPITAL_BASED_OUTPATIENT_CLINIC_OR_DEPARTMENT_OTHER)
Admission: RE | Admit: 2019-03-30 | Discharge: 2019-03-30 | Disposition: A | Discharge status: Home / Self Care | Payer: 59 | Source: Ambulatory Visit | Attending: Family Medicine | Admitting: Family Medicine

## 2019-03-30 ENCOUNTER — Encounter (HOSPITAL_BASED_OUTPATIENT_CLINIC_OR_DEPARTMENT_OTHER): Payer: Self-pay

## 2019-03-30 ENCOUNTER — Other Ambulatory Visit: Payer: Self-pay

## 2019-03-30 DIAGNOSIS — Z1231 Encounter for screening mammogram for malignant neoplasm of breast: Secondary | ICD-10-CM | POA: Diagnosis present

## 2020-04-03 ENCOUNTER — Other Ambulatory Visit (HOSPITAL_BASED_OUTPATIENT_CLINIC_OR_DEPARTMENT_OTHER): Payer: Self-pay | Admitting: Obstetrics and Gynecology

## 2020-04-03 DIAGNOSIS — Z1231 Encounter for screening mammogram for malignant neoplasm of breast: Secondary | ICD-10-CM

## 2020-04-16 ENCOUNTER — Ambulatory Visit (HOSPITAL_BASED_OUTPATIENT_CLINIC_OR_DEPARTMENT_OTHER): Payer: 59

## 2020-04-17 ENCOUNTER — Other Ambulatory Visit: Payer: Self-pay

## 2020-04-17 ENCOUNTER — Encounter (HOSPITAL_BASED_OUTPATIENT_CLINIC_OR_DEPARTMENT_OTHER): Payer: Self-pay

## 2020-04-17 ENCOUNTER — Ambulatory Visit (HOSPITAL_BASED_OUTPATIENT_CLINIC_OR_DEPARTMENT_OTHER)
Admission: RE | Admit: 2020-04-17 | Discharge: 2020-04-17 | Disposition: A | Discharge status: Home / Self Care | Payer: 59 | Source: Ambulatory Visit | Attending: Obstetrics and Gynecology | Admitting: Obstetrics and Gynecology

## 2020-04-17 DIAGNOSIS — Z1231 Encounter for screening mammogram for malignant neoplasm of breast: Secondary | ICD-10-CM | POA: Diagnosis not present

## 2021-05-30 ENCOUNTER — Other Ambulatory Visit (HOSPITAL_BASED_OUTPATIENT_CLINIC_OR_DEPARTMENT_OTHER): Payer: Self-pay | Admitting: Family Medicine

## 2021-05-30 DIAGNOSIS — Z1231 Encounter for screening mammogram for malignant neoplasm of breast: Secondary | ICD-10-CM

## 2021-06-04 ENCOUNTER — Other Ambulatory Visit: Payer: Self-pay

## 2021-06-04 ENCOUNTER — Encounter (HOSPITAL_BASED_OUTPATIENT_CLINIC_OR_DEPARTMENT_OTHER): Payer: Self-pay

## 2021-06-04 ENCOUNTER — Ambulatory Visit (HOSPITAL_BASED_OUTPATIENT_CLINIC_OR_DEPARTMENT_OTHER)
Admission: RE | Admit: 2021-06-04 | Discharge: 2021-06-04 | Disposition: A | Discharge status: Home / Self Care | Payer: 59 | Source: Ambulatory Visit | Attending: Family Medicine | Admitting: Family Medicine

## 2021-06-04 DIAGNOSIS — Z1231 Encounter for screening mammogram for malignant neoplasm of breast: Secondary | ICD-10-CM | POA: Diagnosis not present

## 2022-08-13 ENCOUNTER — Other Ambulatory Visit (HOSPITAL_BASED_OUTPATIENT_CLINIC_OR_DEPARTMENT_OTHER): Payer: Self-pay | Admitting: Family Medicine

## 2022-08-13 DIAGNOSIS — Z1231 Encounter for screening mammogram for malignant neoplasm of breast: Secondary | ICD-10-CM

## 2022-08-18 ENCOUNTER — Inpatient Hospital Stay (HOSPITAL_BASED_OUTPATIENT_CLINIC_OR_DEPARTMENT_OTHER): Admission: RE | Admit: 2022-08-18 | Payer: 59 | Source: Ambulatory Visit
